# Patient Record
Sex: Female | Born: 1965
Health system: Southern US, Community
[De-identification: ages and names within clinical notes are randomized; demographics above are authoritative.]

## PROBLEM LIST (undated history)

## (undated) DIAGNOSIS — E079 Disorder of thyroid, unspecified: Secondary | ICD-10-CM

## (undated) DIAGNOSIS — F32A Depression, unspecified: Secondary | ICD-10-CM

## (undated) DIAGNOSIS — K219 Gastro-esophageal reflux disease without esophagitis: Secondary | ICD-10-CM

## (undated) DIAGNOSIS — E282 Polycystic ovarian syndrome: Secondary | ICD-10-CM

## (undated) DIAGNOSIS — E119 Type 2 diabetes mellitus without complications: Secondary | ICD-10-CM

## (undated) DIAGNOSIS — D649 Anemia, unspecified: Secondary | ICD-10-CM

## (undated) DIAGNOSIS — I10 Essential (primary) hypertension: Secondary | ICD-10-CM

## (undated) DIAGNOSIS — Z85850 Personal history of malignant neoplasm of thyroid: Secondary | ICD-10-CM

## (undated) DIAGNOSIS — F419 Anxiety disorder, unspecified: Secondary | ICD-10-CM

## (undated) DIAGNOSIS — C801 Malignant (primary) neoplasm, unspecified: Secondary | ICD-10-CM

## (undated) DIAGNOSIS — T7840XA Allergy, unspecified, initial encounter: Secondary | ICD-10-CM

## (undated) HISTORY — DX: Malignant (primary) neoplasm, unspecified: C80.1

## (undated) HISTORY — DX: Allergy, unspecified, initial encounter: T78.40XA

## (undated) HISTORY — DX: Gastro-esophageal reflux disease without esophagitis: K21.9

## (undated) HISTORY — DX: Disorder of thyroid, unspecified: E07.9

## (undated) HISTORY — DX: Personal history of malignant neoplasm of thyroid: Z85.850

## (undated) HISTORY — DX: Anxiety disorder, unspecified: F41.9

## (undated) HISTORY — DX: Depression, unspecified: F32.A

## (undated) HISTORY — DX: Anemia, unspecified: D64.9

## (undated) HISTORY — DX: Type 2 diabetes mellitus without complications: E11.9

## (undated) HISTORY — DX: Polycystic ovarian syndrome: E28.2

---

## 1998-01-15 ENCOUNTER — Other Ambulatory Visit: Admission: RE | Admit: 1998-01-15 | Discharge: 1998-01-15 | Payer: Self-pay | Admitting: Gynecology

## 1999-01-21 ENCOUNTER — Other Ambulatory Visit: Admission: RE | Admit: 1999-01-21 | Discharge: 1999-01-21 | Payer: Self-pay | Admitting: Gynecology

## 2000-03-01 ENCOUNTER — Other Ambulatory Visit: Admission: RE | Admit: 2000-03-01 | Discharge: 2000-03-01 | Payer: Self-pay | Admitting: Gynecology

## 2000-05-01 ENCOUNTER — Inpatient Hospital Stay (HOSPITAL_COMMUNITY): Admission: AD | Admit: 2000-05-01 | Discharge: 2000-05-01 | Payer: Self-pay | Admitting: Gynecology

## 2000-08-25 ENCOUNTER — Inpatient Hospital Stay (HOSPITAL_COMMUNITY): Admission: RE | Admit: 2000-08-25 | Discharge: 2000-08-25 | Payer: Self-pay | Admitting: Obstetrics and Gynecology

## 2000-08-25 ENCOUNTER — Encounter: Payer: Self-pay | Admitting: Obstetrics and Gynecology

## 2000-08-26 ENCOUNTER — Inpatient Hospital Stay (HOSPITAL_COMMUNITY): Admission: AD | Admit: 2000-08-26 | Discharge: 2000-08-26 | Payer: Self-pay | Admitting: Obstetrics and Gynecology

## 2000-09-15 ENCOUNTER — Encounter: Payer: Self-pay | Admitting: Obstetrics and Gynecology

## 2000-09-15 ENCOUNTER — Ambulatory Visit (HOSPITAL_COMMUNITY): Admission: RE | Admit: 2000-09-15 | Discharge: 2000-09-15 | Payer: Self-pay | Admitting: Obstetrics and Gynecology

## 2000-09-28 ENCOUNTER — Encounter: Admission: RE | Admit: 2000-09-28 | Discharge: 2000-12-27 | Payer: Self-pay | Admitting: Obstetrics and Gynecology

## 2000-11-07 ENCOUNTER — Observation Stay (HOSPITAL_COMMUNITY): Admission: AD | Admit: 2000-11-07 | Discharge: 2000-11-08 | Payer: Self-pay | Admitting: Obstetrics and Gynecology

## 2000-11-08 ENCOUNTER — Encounter: Payer: Self-pay | Admitting: Obstetrics and Gynecology

## 2000-11-27 ENCOUNTER — Inpatient Hospital Stay (HOSPITAL_COMMUNITY): Admission: AD | Admit: 2000-11-27 | Discharge: 2000-11-30 | Payer: Self-pay | Admitting: Obstetrics and Gynecology

## 2001-03-10 ENCOUNTER — Other Ambulatory Visit: Admission: RE | Admit: 2001-03-10 | Discharge: 2001-03-10 | Payer: Self-pay | Admitting: Gynecology

## 2002-01-15 ENCOUNTER — Emergency Department (HOSPITAL_COMMUNITY): Admission: EM | Admit: 2002-01-15 | Discharge: 2002-01-15 | Payer: Self-pay | Admitting: Emergency Medicine

## 2002-01-15 ENCOUNTER — Encounter: Payer: Self-pay | Admitting: Emergency Medicine

## 2002-02-21 ENCOUNTER — Emergency Department (HOSPITAL_COMMUNITY): Admission: EM | Admit: 2002-02-21 | Discharge: 2002-02-21 | Payer: Self-pay | Admitting: Emergency Medicine

## 2002-03-02 ENCOUNTER — Other Ambulatory Visit: Admission: RE | Admit: 2002-03-02 | Discharge: 2002-03-02 | Payer: Self-pay | Admitting: Gynecology

## 2002-03-14 ENCOUNTER — Encounter: Payer: Self-pay | Admitting: Gastroenterology

## 2002-03-14 ENCOUNTER — Ambulatory Visit (HOSPITAL_COMMUNITY): Admission: RE | Admit: 2002-03-14 | Discharge: 2002-03-14 | Payer: Self-pay | Admitting: Gastroenterology

## 2002-04-14 ENCOUNTER — Emergency Department (HOSPITAL_COMMUNITY): Admission: EM | Admit: 2002-04-14 | Discharge: 2002-04-14 | Payer: Self-pay | Admitting: Emergency Medicine

## 2002-04-24 ENCOUNTER — Encounter: Payer: Self-pay | Admitting: Surgery

## 2002-04-24 ENCOUNTER — Encounter (INDEPENDENT_AMBULATORY_CARE_PROVIDER_SITE_OTHER): Payer: Self-pay

## 2002-04-24 ENCOUNTER — Observation Stay (HOSPITAL_COMMUNITY): Admission: RE | Admit: 2002-04-24 | Discharge: 2002-04-25 | Payer: Self-pay | Admitting: Surgery

## 2002-05-18 DIAGNOSIS — C801 Malignant (primary) neoplasm, unspecified: Secondary | ICD-10-CM

## 2002-05-18 HISTORY — DX: Malignant (primary) neoplasm, unspecified: C80.1

## 2002-05-18 HISTORY — PX: CHOLECYSTECTOMY: SHX55

## 2003-05-19 HISTORY — PX: THYROIDECTOMY: SHX17

## 2003-06-27 ENCOUNTER — Other Ambulatory Visit: Admission: RE | Admit: 2003-06-27 | Discharge: 2003-06-27 | Payer: Self-pay | Admitting: Gynecology

## 2003-07-10 ENCOUNTER — Encounter (HOSPITAL_COMMUNITY): Admission: RE | Admit: 2003-07-10 | Discharge: 2003-10-08 | Payer: Self-pay | Admitting: Family Medicine

## 2003-07-18 ENCOUNTER — Encounter (INDEPENDENT_AMBULATORY_CARE_PROVIDER_SITE_OTHER): Payer: Self-pay | Admitting: Specialist

## 2003-07-18 ENCOUNTER — Ambulatory Visit (HOSPITAL_COMMUNITY): Admission: RE | Admit: 2003-07-18 | Discharge: 2003-07-18 | Payer: Self-pay | Admitting: Family Medicine

## 2003-08-30 ENCOUNTER — Encounter (INDEPENDENT_AMBULATORY_CARE_PROVIDER_SITE_OTHER): Payer: Self-pay | Admitting: Specialist

## 2003-08-30 ENCOUNTER — Observation Stay (HOSPITAL_COMMUNITY): Admission: RE | Admit: 2003-08-30 | Discharge: 2003-08-31 | Payer: Self-pay | Admitting: Surgery

## 2003-09-27 ENCOUNTER — Observation Stay (HOSPITAL_COMMUNITY): Admission: RE | Admit: 2003-09-27 | Discharge: 2003-09-28 | Payer: Self-pay | Admitting: Surgery

## 2003-09-27 ENCOUNTER — Encounter (INDEPENDENT_AMBULATORY_CARE_PROVIDER_SITE_OTHER): Payer: Self-pay | Admitting: Specialist

## 2003-12-10 ENCOUNTER — Encounter (HOSPITAL_COMMUNITY): Admission: RE | Admit: 2003-12-10 | Discharge: 2004-03-09 | Payer: Self-pay | Admitting: Endocrinology

## 2003-12-24 ENCOUNTER — Emergency Department (HOSPITAL_COMMUNITY): Admission: EM | Admit: 2003-12-24 | Discharge: 2003-12-24 | Payer: Self-pay | Admitting: Emergency Medicine

## 2004-02-18 ENCOUNTER — Encounter (HOSPITAL_COMMUNITY): Admission: RE | Admit: 2004-02-18 | Discharge: 2004-05-15 | Payer: Self-pay | Admitting: Endocrinology

## 2004-02-27 ENCOUNTER — Ambulatory Visit (HOSPITAL_COMMUNITY): Admission: RE | Admit: 2004-02-27 | Discharge: 2004-02-27 | Payer: Self-pay | Admitting: Endocrinology

## 2004-03-05 ENCOUNTER — Encounter (HOSPITAL_COMMUNITY): Admission: RE | Admit: 2004-03-05 | Discharge: 2004-05-15 | Payer: Self-pay | Admitting: Endocrinology

## 2004-11-24 ENCOUNTER — Encounter (HOSPITAL_COMMUNITY): Admission: RE | Admit: 2004-11-24 | Discharge: 2005-02-22 | Payer: Self-pay | Admitting: Endocrinology

## 2004-12-26 ENCOUNTER — Ambulatory Visit (HOSPITAL_COMMUNITY): Admission: RE | Admit: 2004-12-26 | Discharge: 2004-12-26 | Payer: Self-pay | Admitting: Endocrinology

## 2005-02-24 ENCOUNTER — Ambulatory Visit (HOSPITAL_COMMUNITY): Admission: RE | Admit: 2005-02-24 | Discharge: 2005-02-24 | Payer: Self-pay | Admitting: Endocrinology

## 2005-03-06 ENCOUNTER — Ambulatory Visit (HOSPITAL_COMMUNITY): Admission: RE | Admit: 2005-03-06 | Discharge: 2005-03-06 | Payer: Self-pay | Admitting: Endocrinology

## 2005-10-26 IMAGING — CR DG CHEST 2V
2 series · 2 of 2 positions shown · non-contrast
Comparison: none

CLINICAL DATA: Nonsmoker.  No chest complaints.  Left thyroid nodule.
 CHEST, TWO VIEWS 
 PA and lateral views of the chest are made without previous films for comparison and show the heart, lungs, bony thorax, and soft tissues to be within the limits of normal. 
 IMPRESSION
 Normal chest.

[view not recorded (1 of 2)]
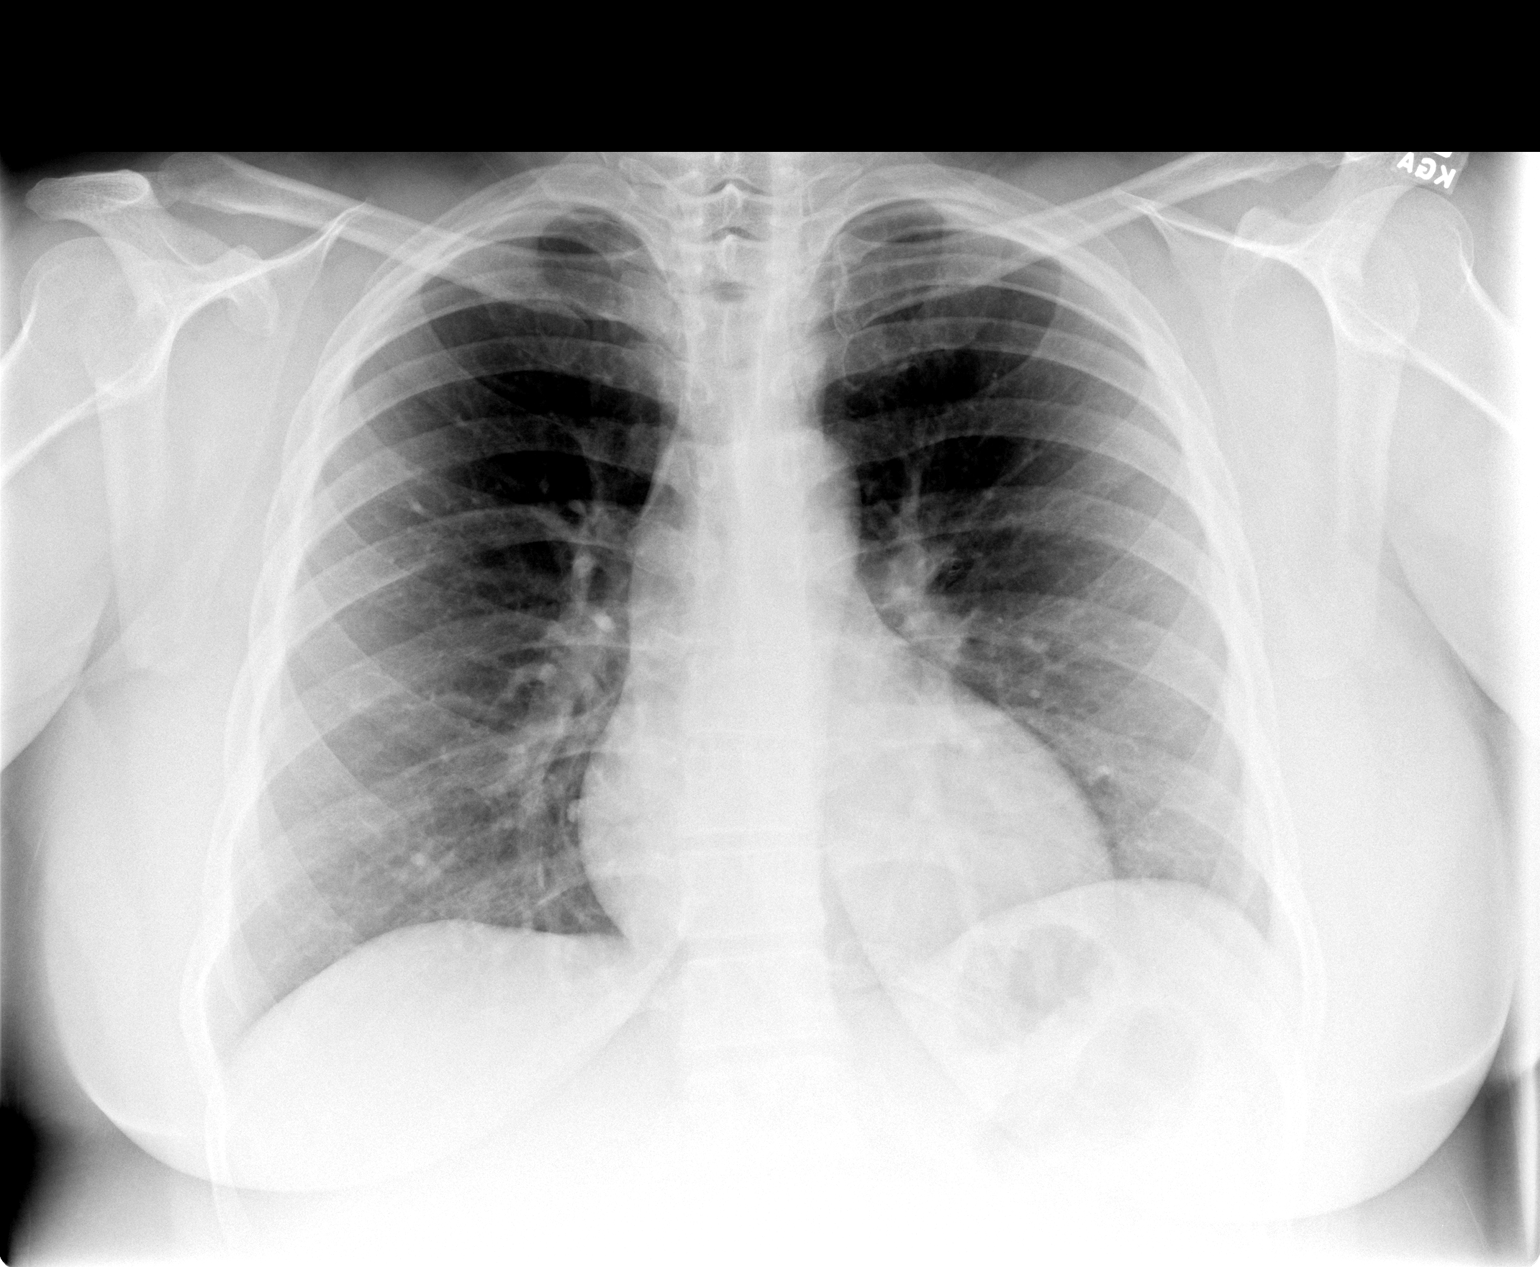

[view not recorded (2 of 2)]
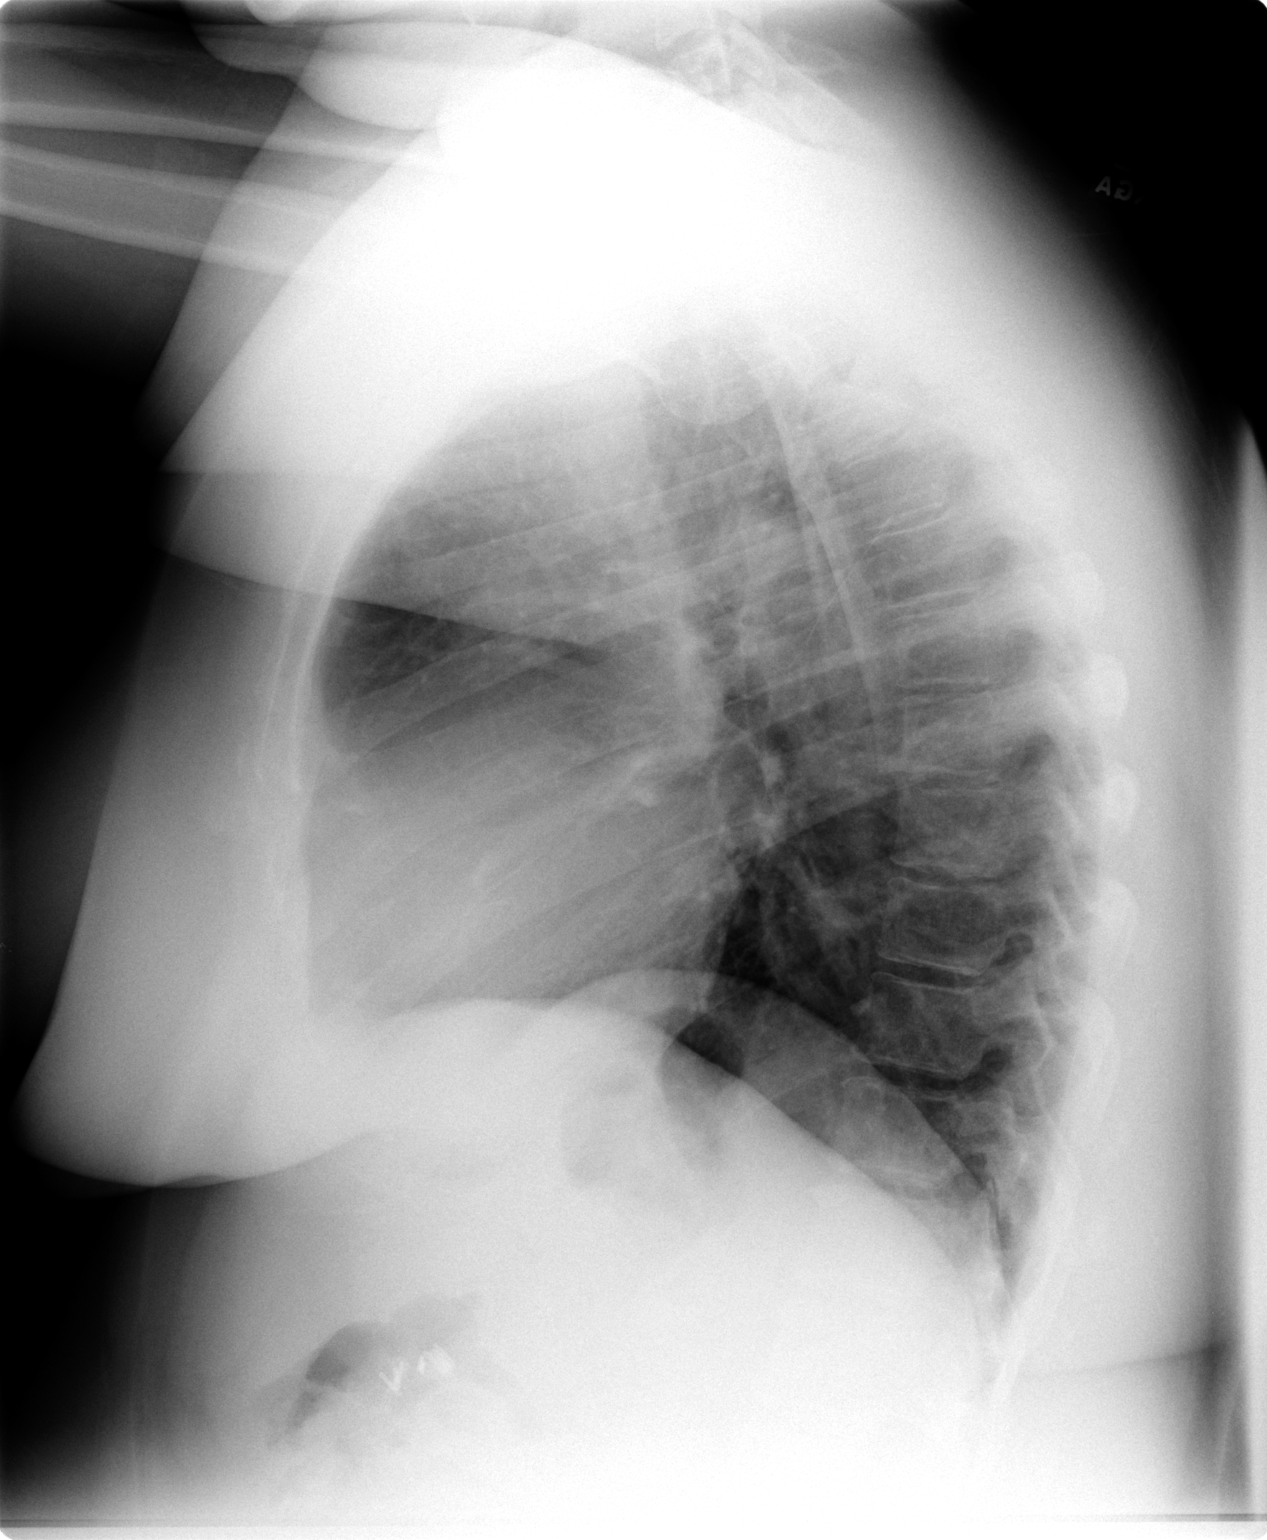

[2 of 2 positions shown; findings below may reference images not displayed]

## 2006-02-02 ENCOUNTER — Ambulatory Visit: Payer: Self-pay | Admitting: Family Medicine

## 2006-12-07 ENCOUNTER — Ambulatory Visit: Payer: Self-pay | Admitting: Family Medicine

## 2008-02-16 ENCOUNTER — Ambulatory Visit: Payer: Self-pay | Admitting: Family Medicine

## 2008-04-25 ENCOUNTER — Ambulatory Visit: Payer: Self-pay | Admitting: Family Medicine

## 2009-03-25 ENCOUNTER — Ambulatory Visit: Payer: Self-pay | Admitting: Family Medicine

## 2009-04-05 ENCOUNTER — Ambulatory Visit: Payer: Self-pay | Admitting: Family Medicine

## 2009-07-11 ENCOUNTER — Ambulatory Visit: Payer: Self-pay | Admitting: Family Medicine

## 2010-05-05 ENCOUNTER — Ambulatory Visit: Payer: Self-pay | Admitting: Family Medicine

## 2010-06-08 ENCOUNTER — Encounter: Payer: Self-pay | Admitting: Obstetrics and Gynecology

## 2010-06-08 ENCOUNTER — Encounter: Payer: Self-pay | Admitting: Endocrinology

## 2010-07-07 ENCOUNTER — Encounter (HOSPITAL_COMMUNITY): Payer: Self-pay | Admitting: Radiology

## 2010-07-07 ENCOUNTER — Emergency Department (HOSPITAL_COMMUNITY): Payer: BC Managed Care – PPO

## 2010-07-07 ENCOUNTER — Emergency Department (HOSPITAL_COMMUNITY)
Admission: EM | Admit: 2010-07-07 | Discharge: 2010-07-07 | Disposition: A | Discharge status: Home / Self Care | Payer: BC Managed Care – PPO | Attending: Emergency Medicine | Admitting: Emergency Medicine

## 2010-07-07 DIAGNOSIS — R0989 Other specified symptoms and signs involving the circulatory and respiratory systems: Secondary | ICD-10-CM | POA: Insufficient documentation

## 2010-07-07 DIAGNOSIS — E876 Hypokalemia: Secondary | ICD-10-CM | POA: Insufficient documentation

## 2010-07-07 DIAGNOSIS — R05 Cough: Secondary | ICD-10-CM | POA: Insufficient documentation

## 2010-07-07 DIAGNOSIS — R0609 Other forms of dyspnea: Secondary | ICD-10-CM | POA: Insufficient documentation

## 2010-07-07 DIAGNOSIS — I10 Essential (primary) hypertension: Secondary | ICD-10-CM | POA: Insufficient documentation

## 2010-07-07 DIAGNOSIS — Z79899 Other long term (current) drug therapy: Secondary | ICD-10-CM | POA: Insufficient documentation

## 2010-07-07 DIAGNOSIS — E039 Hypothyroidism, unspecified: Secondary | ICD-10-CM | POA: Insufficient documentation

## 2010-07-07 DIAGNOSIS — R059 Cough, unspecified: Secondary | ICD-10-CM | POA: Insufficient documentation

## 2010-07-07 DIAGNOSIS — J189 Pneumonia, unspecified organism: Secondary | ICD-10-CM | POA: Insufficient documentation

## 2010-07-07 DIAGNOSIS — R0602 Shortness of breath: Secondary | ICD-10-CM | POA: Insufficient documentation

## 2010-07-07 HISTORY — DX: Essential (primary) hypertension: I10

## 2010-07-07 LAB — BASIC METABOLIC PANEL
BUN: 14 mg/dL (ref 6–23)
CO2: 24 mEq/L (ref 19–32)
Calcium: 8.7 mg/dL (ref 8.4–10.5)
Chloride: 104 mEq/L (ref 96–112)
Creatinine, Ser: 1.01 mg/dL (ref 0.4–1.2)
Glucose, Bld: 126 mg/dL — ABNORMAL HIGH (ref 70–99)
Potassium: 3.4 mEq/L — ABNORMAL LOW (ref 3.5–5.1)
Sodium: 137 mEq/L (ref 135–145)

## 2010-07-07 LAB — CBC
Hemoglobin: 9.5 g/dL — ABNORMAL LOW (ref 12.0–15.0)
MCH: 23 pg — ABNORMAL LOW (ref 26.0–34.0)
MCHC: 30.5 g/dL (ref 30.0–36.0)
RBC: 4.13 MIL/uL (ref 3.87–5.11)
RDW: 16 % — ABNORMAL HIGH (ref 11.5–15.5)
WBC: 6.5 10*3/uL (ref 4.0–10.5)

## 2010-07-07 LAB — D-DIMER, QUANTITATIVE: D-Dimer, Quant: 0.71 ug/mL-FEU — ABNORMAL HIGH (ref 0.00–0.48)

## 2010-07-07 LAB — DIFFERENTIAL
Basophils Absolute: 0 10*3/uL (ref 0.0–0.1)
Basophils Relative: 0 % (ref 0–1)
Eosinophils Absolute: 0.1 10*3/uL (ref 0.0–0.7)
Eosinophils Relative: 2 % (ref 0–5)
Lymphocytes Relative: 31 % (ref 12–46)
Lymphs Abs: 2 10*3/uL (ref 0.7–4.0)
Monocytes Absolute: 0.4 10*3/uL (ref 0.1–1.0)
Monocytes Relative: 6 % (ref 3–12)
Neutro Abs: 4 10*3/uL (ref 1.7–7.7)

## 2010-07-07 LAB — POCT CARDIAC MARKERS
CKMB, poc: <1 ng/mL — ABNORMAL LOW (ref 1.0–8.0)
Myoglobin, poc: 90.5 ng/mL (ref 12–200)
Troponin i, poc: <0.05 ng/mL (ref 0.00–0.09)

## 2010-07-07 MED ORDER — IOHEXOL 300 MG/ML  SOLN
100.0000 mL | Freq: Once | INTRAMUSCULAR | Status: AC | PRN
  Administered 2010-07-07: 100 mL via INTRAVENOUS

## 2010-07-10 ENCOUNTER — Inpatient Hospital Stay (INDEPENDENT_AMBULATORY_CARE_PROVIDER_SITE_OTHER): Payer: BC Managed Care – PPO | Admitting: Family Medicine

## 2010-07-10 DIAGNOSIS — D649 Anemia, unspecified: Secondary | ICD-10-CM

## 2010-07-10 DIAGNOSIS — J189 Pneumonia, unspecified organism: Secondary | ICD-10-CM

## 2010-09-04 ENCOUNTER — Ambulatory Visit (INDEPENDENT_AMBULATORY_CARE_PROVIDER_SITE_OTHER): Payer: BC Managed Care – PPO | Admitting: Family Medicine

## 2010-09-04 ENCOUNTER — Other Ambulatory Visit: Payer: Self-pay | Admitting: Family Medicine

## 2010-09-04 ENCOUNTER — Ambulatory Visit
Admission: RE | Admit: 2010-09-04 | Discharge: 2010-09-04 | Disposition: A | Discharge status: Home / Self Care | Payer: BC Managed Care – PPO | Source: Ambulatory Visit | Attending: Family Medicine | Admitting: Family Medicine

## 2010-09-04 DIAGNOSIS — R0602 Shortness of breath: Secondary | ICD-10-CM

## 2010-09-04 DIAGNOSIS — D649 Anemia, unspecified: Secondary | ICD-10-CM

## 2010-09-04 DIAGNOSIS — Z79899 Other long term (current) drug therapy: Secondary | ICD-10-CM

## 2010-09-04 DIAGNOSIS — R948 Abnormal results of function studies of other organs and systems: Secondary | ICD-10-CM

## 2010-09-09 ENCOUNTER — Other Ambulatory Visit (INDEPENDENT_AMBULATORY_CARE_PROVIDER_SITE_OTHER): Payer: BC Managed Care – PPO

## 2010-09-09 DIAGNOSIS — Z79899 Other long term (current) drug therapy: Secondary | ICD-10-CM

## 2010-10-03 NOTE — Op Note (Signed)
NAME:  Katie Aguirre, Katie Aguirre                      ACCOUNT NO.:  [id number]   MEDICAL RECORD NO.:  [id number]                   PATIENT TYPE:  OBV   LOCATION:  6962                                 FACILITY:  [facility]   PHYSICIAN:  Abigail Miyamoto, M.D.              DATE OF BIRTH:  [birth year]   DATE OF PROCEDURE:  08/30/2003  DATE OF DISCHARGE:                                 OPERATIVE REPORT   PREOPERATIVE DIAGNOSIS:  Left thyroid nodule.   POSTOPERATIVE DIAGNOSIS:  Left thyroid nodule.   PROCEDURE:  Left thyroid lobectomy.   SURGEON:  Dr. Riley Lam A. Magnus Ivan.   ASSISTANT:  Dr. Cyndia Bent.   ANESTHESIA:  General endotracheal.   ESTIMATED BLOOD LOSS:  Minimal.   INDICATIONS:  Katie Aguirre is a 45 year old female, who was found to  have a left thyroid nodule.  Fine needle aspiration revealed follicular  cells.  Ultrasound showed this to have both cystic and solid components.   FINDINGS:  The patient was found to have a cystic and solid nodule in the  left lobe of the thyroid gland.  Frozen section revealed this to be  consistent with a benign hyperplastic nodule.   PROCEDURE IN DETAIL:  The patient was brought to the operating room and  identified as Katie Aguirre.  She was placed supine on the operating  table, and general anesthesia was induced.  Her neck was then prepped and  draped in the usual sterile fashion.  Using a #15 blade, a transverse  incision was then created on the lower neck.  Incision was carried down  through the platysma with electrocautery.  Superior and inferior skin flaps  were then created with the electrocautery.  The midline was then identified  and opened with the electrocautery.  The strap muscles were then reflected  laterally.  The thyroid gland was easily identified and retracted medially.  Dissection was then carried out around the capsule of the thyroid gland.  The middle vein was easily identified and clipped proximally and distally  and transected.  The upper pole of the thyroid gland was then controlled  with 2-0 silk ties as well as surgical clips before being released.  Dissection was done along the capsule in order to stay close to the thyroid  gland and avoid any nerve injuries.  The mid portion of the thyroid gland  was stuck to the structures of the neck and had to be dissected out  carefully with the right-angle clamp as well as the electrocautery and  surgical clips.  The middle thyroid artery was identified and clipped as  well.  The recurrent laryngeal nerve was also identified and spared.  The  parathyroid glands were likewise identified and left intact.  Dissection was  then carried out further along the inferior pole which was released with  both the electrocautery and surgical clips as well.  The remaining gland was  then freed up off the trachea with  the electrocautery.  The nodule was  identified and was found to be both cystic and solid in components.  Once  the gland was completely removed in its entirety with electrocautery, it was  sent to pathology for identification.  Frozen section again revealed a  benign-appearing nodule.  At this point, the neck was thoroughly irrigated  with normal saline.  Again, hemostasis appeared to be achieved with the  surgical clips as well as the piece of Surgicel.  The midline was then  reapproximated with interrupted 3-0 Vicryl sutures.  The platysma was then  closed with interrupted 3-0 Vicryl sutures.  The skin was closed with a  running 4-0 Monocryl.  Steri-Strips, gauze, and tape were then applied.  The patient tolerated the  procedure well.  All sponge, needle, and instrument counts were correct at  the end of the procedure.  The patient was then extubated in the operating  room and taken in stable condition to the recovery room.                                               Abigail Miyamoto, M.D.    DB/MEDQ  D:  08/30/2003  T:  08/30/2003  Job:  [id number]

## 2010-10-03 NOTE — Discharge Summary (Signed)
[facility]  Patient:    Katie Aguirre, Katie Aguirre                     MRN: [id number] Adm. Date:  [id number] Disc. Date: [id number] Attending:  Shaune Spittle Dictator:   Vance Gather Duplantis, C.N.M.                           Discharge Summary  ADMISSION DIAGNOSES:          1. Intrauterine pregnancy at [redacted] weeks                                  gestation.                               2. Status post fall.                               3. A 0.3% positive Kleihauer-Betke.  DISCHARGE DIAGNOSES:          1. Intrauterine pregnancy at [redacted] weeks                                  gestation.                               2. Status post fall.                               3. A 0.3% positive Kleihauer-Betke.  PROCEDURES THIS ADMISSION:    None.  HOSPITAL COURSE:              Ms. Katie Aguirre (date of birth: 1965-09-16) is a 45 year old married black female, gravida 2, para 0-0-1-0 at [redacted] weeks gestation who was admitted for observation secondary to a fall as she was leaving church yesterday, June 23. She was evaluated initially in Maternity Admissions and found to have a 0.3% positive Kleihauer-Betke test for which then she was recommended to be admitted for overnight observation. Her fetal heart rate has remained reactive and reassuring throughout her hospital stay. She has had no vaginal bleeding or leaking. She reports positive fetal movement. She has no abdominal pain and no trauma to any other limbs or parts of her body. She had an ultrasound this morning that had a biophysical profile score of 8/8 with a placenta that is posterior and grade 1 with no signs of abruption noted and AFI of 15.0 cm. She is deemed ready for discharge at this time.  DISCHARGE INSTRUCTIONS:       She is to call for signs and symptoms of labor, for vaginal bleeding, or increasing abdominal pain.  DISCHARGE LABORATORIES:       Her hemoglobin is 11.3, her WBC count is 11.6, her  platelets are 339,000, and her Kleihauer-Betke had 0.3% fetal cells noted. Her blood type is B positive.  DISCHARGE FOLLOWUP:           The patient is to follow up on Wednesday, June 26 for her regularly scheduled prenatal visit or p.r.n. DD:  11/08/00 TD:  11/08/00 Job: [id number]

## 2010-10-03 NOTE — Op Note (Signed)
[facility]  Patient:    Katie Aguirre, Katie Aguirre                     MRN: [id number] Proc. Date: 11/28/00 Adm. Date:  [id number] Attending:  Leonard Schwartz                           Operative Report  PREOPERATIVE DIAGNOSES:       1. A 38-week gestation.                               2. Hypertension.                               3. Diabetes.  POSTOPERATIVE DIAGNOSES:      1. A 38-week gestation.                               2. Hypertension.                               3. Diabetes.                               4. Shoulder dystocia.  PROCEDURE:                    Normal spontaneous vaginal delivery with repair of second-degree midline laceration.  OBSTETRICIAN:                 Janine Limbo, M.D.  ANESTHESIA:                   Epidural and 1% Xylocaine.  INDICATIONS:                  The patient is a 45 year old female, gravida 2, para 0-0-1-0 who was admitted at 38-weeks gestation for induction of labor because of hypertension and diabetes.  Cytotec was placed on November 27, 2000. Pitocin was started on November 28, 2000.  The patient labored quickly.  SHe became completely dilated and she pushed effectively.  FINDINGS:                     The weight of the infant is currently not known. A female was delivered from an occiput anterior presentation.  Apgars were 8 at one minute and 9 at five minutes.  There was a three-vessel umbilical cord present.  There was a nuchal cord present.  The placental surfaces appeared normal.  There was a second-degree midline laceration present.  The infant was vigorous, but did not move the right arm as vigorously as the left arm.  I questioned a fractured right clavicle.  DESCRIPTION OF PROCEDURE:     The patient was placed in the lithotomy position.  The perineum was prepped with multiple layers of Betadine and then sterilely draped.  The patient effectively pushed the babys head out.  The mouth and nose  were suctioned.  A nuchal cord was reduced.  Shoulder dystocia was encountered.  McRoberts maneuver was performed.  We were then able to deliver the shoulders without difficulty.  The mother quickly delivered the remainder of the infant.  The infant was allowed to rest below the level of the maternal heart.  The cord was clamped and cut.  The infant was then placed on the mothers abdomen.  The infant was vigorous, but did not move the right arm as vigorously as the left arm.  The placenta was delivered without difficulty.  A second-degree midline laceration was repaired in a standard fashion using 2-0 Vicryl.  Hemostasis was adequate.  The patient was returned to the supine position and allowed to bond with her infant. DD:  11/28/00 TD:  11/28/00 Job: [id number]

## 2010-10-03 NOTE — Discharge Summary (Signed)
[facility] of [facility]  Patient:    Katie Aguirre, Katie Aguirre                     MRN: [id number] Adm. Date:  [id number] Disc. Date: [id number] Attending:  Leonard Schwartz                           Discharge Summary  DISCHARGE DIAGNOSES:          1. 38 gestation.                               2. Hypertension.                               3. Diabetes.                               4. Shoulder dystocia.                               5. Anemia (hemoglobin 9.8).                               6. Questionable fractured clavicle of the                                  infant.  PROCEDURES THIS ADMISSION:    On November 28, 2000, normal spontaneous vaginal delivery with repair of second degree midline laceration.  HISTORY OF PRESENT ILLNESS:   Katie Aguirre is a 45 year old female, gravida 2, para 0-0-1-0, who presented at 39-1/[redacted] weeks gestation for induction of labor. Please see her dictated history and physical exam for details.  ADMISSION PHYSICAL EXAMINATION:                  The patient is afebrile.  Her vital signs are stable.  Her cervix was 2 cm dilated, 50% effaced, and -3 in station.  ADMISSION LABORATORY VALUES:  Blood type is B+.  Rubella immune.  HBSAG negative.  RPR is negative.  HOSPITAL COURSE:              The patient was admitted and she was started on Pitocin.  She labored quickly.  She became completely dilated and pushed effectively.  The patient delivered an 8 pound 5 ounce female infant ([place]). A mild shoulder dystocia was encountered.  The infant was easily delivered, but there was a question of a fractured right clavicle.  The patient tolerated the procedure well.  Her postpartum hemoglobin was 9.8 (the prepartum hemoglobin was 11.3).  She quickly tolerated a regular diet.  On postpartum day #2, she was felt to have recovered significantly and was ready for discharge.  DISPOSITION:                  She was discharged to home in stable  condition.  DISCHARGE MEDICATIONS:        The patient was given Motrin and Tylox for pain. She will continue her prenatal vitamins and also take iron.  FOLLOW-UP:  She will return to [facility]cology in six weeks for a follow-up examination. DD:  12/30/00 TD:  12/30/00 Job: [id number]

## 2010-10-03 NOTE — Consult Note (Signed)
[facility]  Patient:    Katie Aguirre, Katie Aguirre                     MRN: [id number] Adm. Date:  [id number] Attending:  Rolinda Roan                          Consultation Report  EMERGENCY ROOM  REASON FOR CONSULTATION: Vaginal bleeding.  HISTORY OF PRESENT ILLNESS: The patient is a 45 year old gravida 2 para 0 AB 1 female, with her last menstrual period April 08, 2000.  She was seen by Dr. Chevis Pretty yesterday in his office and even though she had not missed a period she was pregnant, and when she started to spot yesterday Dr. Chevis Pretty did an ultrasound, showing a nine week viable pregnancy.  The patient did well yesterday and today, and then tonight she had sudden onset of heavy bleeding associated with some cramping.  She became very concerned because of the heaviness of the bleeding and called me and I asked her to come to the emergency room at [facility] [place].  By the time she got here the cramping and the bleeding had almost stopped, and she was doing fine.  PHYSICAL EXAMINATION: External and vaginal examination was within normal limits.  The cervix was closed.  There was minimal old blood noted in the vagina.  The uterus was enlarged eight to nine week size.  Adnexa palpably normal.  Rectal was negative.  IMPRESSION: Threatened abortion.  RECOMMENDATIONS/PLAN: I discussed with the patient and her husband rescanning her tonight.  I told them, however, that with a viable scan yesterday that even if the scan did not confirm viability or not I would not recommend a dilatation and curettage and would instead ask her to be rescanned during the week, and since she was now stable and the bleeding had stopped I did not really feel that rescanning her tonight would change anything.  The patient and her husband were comfortable with this approach and I have discharged them home.  They are doing to call Dr. Chevis Pretty Monday and schedule  another ultrasound; however, I have asked her to call me if there is any change in her condition. DD:  05/01/00 TD:  05/02/00 Job: [id number]

## 2010-10-03 NOTE — H&P (Signed)
[facility] of [facility]  Patient:    Katie Aguirre, Katie Aguirre                     MRN: [id number] Adm. Date:  [id number] Attending:  Shaune Spittle Dictator:   Mack Guise, C.N.M.                         History and Physical  HISTORY OF PRESENT ILLNESS:   Katie Aguirre is a 45 year old, gravida 2, para 0-0-1-0 at 37 weeks. She presented to [facility] after a fall today. While walking to her car after church, she tripped and landed on her right knee and left side and buttocks and broke her fall with her left hand. Now, she does complain of cramping, no bleeding. She does report positive fetal movement. Her pregnancy has been followed by the MD service at [facility] and is remarkable for (1) a history of chronic hypertension, (2) first trimester spotting, (3) a history of an abnormal Pap smear, (4) history of GC as a teen, (5) history of migraines, and (6) obesity.  PRENATAL LABORATORY DATA:     Prenatal lab work on May 26, 2000 finds hemoglobin and hematocrit 11.4 and 35.8, platelets 294,000, blood type and Rh B positive, antibody screen negative, sickle cell trait negative, rubella immune, hepatitis B surface antigen negative, HIV declined. Pap smear within normal limits, GC and Chlamydia negative. On June 09, 2000 found increased downs syndrome risk on AFP, followup was done with ultrasound. Patient and husband declined amniocentesis. One-hour glucose challenge was elevated and patient underwent three-hour GTT and has been following a 2000 calorie ADA diet.  LABORATORY WORK TODAY:        Patient has white blood cell count 11.6, hemoglobin 11.3 and hematocrit 34.8, platelets 339,000. KLB found fetal cells 0.3% and quantitative fetal hemoglobin 15.  OBSTETRIC HISTORY:            1984, SAB and the present pregnancy.  PAST MEDICAL HISTORY:         History of gonorrhea in 1984, history of chronic hypertension, no medications at the present time and a history of  migraines.  FAMILY HISTORY:               Patients mother with a history of hypertension. Patients sister and mother with a history of anemia, and maternal grandmother and paternal grandmother diabetes. Maternal grandfather kidney disease with dialysis. Maternal grandmother breast cancer.  GENETIC HISTORY:              There is no family history of genetic or chromosomal disorders, children that died in infancy, or were born with birth defects.  SOCIAL HISTORY:               Katie Aguirre is a 45 year old African-American married female. Her husband, Deneene Tarver, is involved and supportive. They are of the [facility] faith.  REVIEW OF SYSTEMS:            Review of systems are as described above. Patient fell, there is no bruising, scratches notable on her right knee, left buttocks, or side and she is moving all extremities well.  PHYSICAL EXAMINATION:  VITAL SIGNS:                  Vital signs stable, afebrile.  HEENT:                        Unremarkable.  HEART:  Regular rate and rhythm.  LUNGS:                        Clear.  ABDOMEN:                      Soft and nontender, gravid in its contour. Uterine fundus is noted to extend 37 cm above the level of the pubic symphysis. Leopolds maneuvers finds the infant to be in a longitudinal lie, cephalic presentation, and the estimated fetal weight is 7 pounds.  ELECTRONIC FETAL MONITOR:     The baseline of the fetal heart rate monitor is 13-140 with average long-term variability, reactivity is present. There is occasional mild variable decelerations noted. The patient has mild uterine irritability noted.  ASSESSMENT:                   Intrauterine pregnancy at 37 weeks, status post fall, Kleihauer-Betke 0.3%.  PLAN:                         Admit for 23 hour observation per Dr. Marline Backbone. Continuous electronic fetal monitoring. Bed rest with bathroom privileges. Regular diet and Ambien for  sleep. DD:  11/07/00 TD:  11/07/00 Job: [id number]

## 2010-10-03 NOTE — Op Note (Signed)
NAMETERRILL, WAUTERS                        ACCOUNT NO.:  [id number]   MEDICAL RECORD NO.:  [id number]                   PATIENT TYPE:  AMB   LOCATION:  DAY                                  FACILITY:  [facility]   PHYSICIAN:  Abigail Miyamoto, M.D.              DATE OF BIRTH:  Feb 21, 1966   DATE OF PROCEDURE:  04/24/2002  DATE OF DISCHARGE:                                 OPERATIVE REPORT   PREOPERATIVE DIAGNOSIS:  Biliary diskinesia.   POSTOPERATIVE DIAGNOSES:  Biliary diskinesia, cholelithiasis, acute  cholecystitis.   SURGEON:  Abigail Miyamoto, M.D.   ASSISTANT:  Rose Phi. Maple Hudson, M.D.   ANESTHESIA:  General endotracheal anesthesia and 0.25% Marcaine.   ESTIMATED BLOOD LOSS:  Minimal.   FINDINGS:  The patient was found to have a normal cholangiogram. The  gallbladder appeared acutely inflamed and had very tiny gallstones in the  gallbladder.   DESCRIPTION OF PROCEDURE:  The patient was brought to the operating room,  identified as Katie Aguirre. She was placed supine on the operating table  and general anesthesia was induced. Her abdomen was then prepped and draped  in the usual sterile fashion. Using a #15 blade, a small transverse incision  was made below the umbilicus. The incision was carried down to the fascia  which was then opened with a scalpel. A hemostat was then used to pass  through the peritoneal cavity. Next #0 Vicryl pursestring suture was placed  around the fascial opening. The Hasson port was placed through the opening  and insufflation of the abdomen was begun. Next, a 12 mm port was placed in  the patient's epigastrium and two 5 mm ports were placed in the patient's  right flank under direct vision. The gallbladder was then identified and  grasped and retracted above the liver bed. The patient was found to have  multiple adhesions from the dome of the liver to the abdominal wall  consistent with previous peritoneal inflammation. The gallbladder itself  appeared acutely inflamed and appeared to contain tiny stones. Multiple  adhesions were then taken down to the neck of the gallbladder. The cystic  duct and artery were found to be closely adherent to each other and were  dissected out as one unit. The cystic duct was able to be separated from the  cystic artery as it came off the gallbladder. The cystic duct was then  clipped once distally. It was then partly transected with the scissors. An  Angiocath was then inserted under direct vision in the right upper quadrant.  The cholangiocath was then passed through the angiocath and into the cystic  duct. The cholangiogram was then performed under direct fluoroscopy. Good  flow of contrast was seen into the entire bile duct and duodenum without  evidence of abnormalities. The cholangiocatheter was then removed. The  cystic duct and artery were then clipped multiple times proximally and  distally before being transected with the scissors.  The gallbladder was then  dissected free from the liver bed with the electrocautery. During  dissection, the posterior branch of the cystic artery was identified and  clipped as well. Once the gallbladder was free from the liver bed,  hemostasis appeared to be achieved in the liver bed. The gallbladder was  then removed through the incision at the umbilicus. The #0 Vicryl suture at  the umbilicus was then tied in place closing the fascial defect. The abdomen  was then again irrigated with normal saline. Hemostasis appeared to be  achieved. All ports were then removed under direct vision and the abdomen  was deflated. All incisions were then anesthetized with 0.25% Marcaine and  then closed with 4-0 Monocryl subcuticular sutures.  Steri-Strips, gauze and tape were then applied. The patient tolerated the  procedure well. All sponge, needle and instrument counts were correct at the  end of the procedure. The patient was then extubated in the operating room  and  taken in stable condition to the recovery room.                                               Abigail Miyamoto, M.D.    DB/MEDQ  D:  04/24/2002  T:  04/24/2002  Job:  [id number]

## 2010-10-03 NOTE — Op Note (Signed)
NAME:  [name], [name]                      ACCOUNT NO.:  [id number]   MEDICAL RECORD NO.:  [id number]                   PATIENT TYPE:  OBV   LOCATION:  0478                                 FACILITY:  [facility]   PHYSICIAN:  Abigail Miyamoto, M.D.              DATE OF BIRTH:  01-15-1966   DATE OF PROCEDURE:  09/27/2003  DATE OF DISCHARGE:                                 OPERATIVE REPORT   PREOPERATIVE DIAGNOSES:  Thyroid cancer.   POSTOPERATIVE DIAGNOSES:  Thyroid cancer.   PROCEDURE:  Completion thyroidectomy.   SURGEON:  Abigail Miyamoto, M.D.   ASSISTANT:  Leonie Man, M.D.   ANESTHESIA:  General endotracheal anesthesia.   ESTIMATED BLOOD LOSS:  Minimal.   INDICATIONS FOR PROCEDURE:  Katie Aguirre is a 45 year old female who had  undergone a left thyroid lobectomy for a thyroid nodule. Frozen section was  consistent with follicular cells and no evidence of carcinoma; however, the  final pathology did reveal papillary cancer.  Given these findings, a  decision was made to proceed with completion thyroidectomy.   FINDINGS:  The previous resection site was completely stuck and _________  the neck therefore an attempt was not made to explore this area.  The right  gland appeared normal and was removed.   DESCRIPTION OF PROCEDURE:  The patient was brought to the operating room,  identified as Katie Aguirre. She was placed supine on the operating room  table and general anesthesia was induced. Her neck was then prepped and  draped in the usual sterile fashion. Using a #15 blade, a transverse  incision was made across the neck the previous scar. The incision was  carried down through the platysma with the electrocautery. Superior and  inferior skin flaps were then created with both blunt dissection and the  electrocautery freeing up the previously created skin flaps from the  previous surgery. The midline sutures were then identified and opened  freeing up the strap  muscles. The left side of the neck was examined and the  strap muscles were found to be fixated under the previous resection site  therefore a decision was made to forego resection here.  This was done in  order to preserve the recurrent laryngeal nerve.  At this point, the rest of  the strap muscles were dissected free from the right thyroid lobe. The lobe  was identified and elevated with blunt dissection.  Dissection was  undertaken to free up the lobe staying right on the thyroid gland. The  middle vein was identified and clipped twice proximally and once distally  and transected with the scissors. The superior pole was then identified, it  was taken down with several silk ties and surgical clips as well freeing up  the superior pole.  The parathyroid gland was identified and spared during  the dissection. The inferior pole was likewise taken down with 2-0 silk ties  as well as surgical clips.  This allowed  [place] to dissect toward the hilum of  the right lobe.  The dissection was carried to stay right on top of the  gland and several of the vessels were controlled with surgical clips and  transected with the Metzenbaum scissors. The recurrent laryngeal nerve was  easily identified and was spared.  The inferior parathyroid gland could not  be identified. At this point, after the rest of the vessels were identified  including the inferior thyroid artery which was transected, the thyroid was  dissected up off the trachea. The dissection was carried toward the midline  and the rest of the thyroid gland was removed. The right lobe was removed in  its entirety and sent to pathology for identification.  The wound was then  thoroughly irrigated with normal saline.  Hemostasis appeared to be  achieved. A small piece of Surgicel was then left in the wound.  The midline  strap muscles were then closed with running 2-0 Vicryl suture. The platysma  was then closed with interrupted 3-0 Vicryl sutures,  the skin was closed  with a running 4-0 Vicryl suture.  Steri-Strips, gauze and tape were then  applied. The patient tolerated the procedure well. All counts were correct  at the end of the procedure. The patient was then extubated in the operating  room and taken in stable condition to the recovery room.                                               Abigail Miyamoto, M.D.    DB/MEDQ  D:  09/27/2003  T:  09/27/2003  Job:  [id number]

## 2010-10-03 NOTE — H&P (Signed)
[facility]  Patient:    Katie Aguirre, Katie Aguirre                     MRN: [id number] Attending:  Janine Limbo, M.D. Dictator:   Philipp Deputy, C.N.M.                         History and Physical  DATE OF BIRTH:                12/06/1965  HISTORY OF PRESENT ILLNESS:   The patient is a 45 year old married African-American female gravida 2, para 0-0-1-0, at 39-3/7 weeks estimated gestational age who presents for scheduled induction of labor secondary to chronic hypertension and gestational diabetes mellitus.  Her pregnancy has been followed by the [facility] Ob/Gyn MD service and has been remarkable for:  1. Post trimester spotting. 2. Chronic hypertension. 3. History of abnormal Pap smear. 4. Obesity. 5. Migraines. 6. Gestational diabetes mellitus. 7. Group B strep negative.  PRENATAL LABORATORY DATA:     Blood type B-positive.  Antibody negative. Sickle cell trait negative.  Rubella immune.  Hepatitis B surface antigen negative.  Pap smear within normal limits, March 01, 2000.  Gonorrhea negative.  Chlamydia negative.  Hemoglobin and hematocrit 11.4 and 35.8, platelets 294,000.  These prenatal labs are from May 26, 2000.  On June 09, 2000, maternal serum alpha fetoprotein showed __________ syndrome risk. On August 27, 2000, one-hour glucola was elevated at 184.  On Sep 16, 2000, she attempted her three-hour glucose tolerance test.  Fasting blood sugar result was 101 and she vomited the rest of the glucola.  Culture of the vaginal tract for group B strep on November 03, 2000 was negative.  HISTORY OF PRESENT PREGNANCY:                    She presented for care at [redacted] weeks gestation. Ultrasound from June 30, 2000, with significant amount of anatomy not seen secondary to maternal body habitus.  On July 05, 2000, the patient had a 24-hour urine for protein as well as blood drawn for creatinine clearance. The patient was placed on  bed rest at [redacted] weeks gestation secondary to preterm cervical change.  She received betamethasone as well.  Cervix continued to show funneling at 28 to [redacted] weeks gestation.  Bed rest, _______ continued, as well as Macrobid given for urinary tract infection and _________ had severe itching at approximately 34 weeks for which she Dr. Joseph Art and was prescribed prednisone which she did not take.  The itching self-resolved.  The cervix continued to be closed, but significant effaced.  The patient was allowed to return to work at 36 weeks and the rest of her prenatal care was noncontributory.  OB HISTORY:                   In 1984, she had a spontaneous AB with no D&C. This pregnancy is her second pregnancy.  ALLERGIES:                    SULFA AND PENICILLIN.  PAST MEDICAL HISTORY:         She has a questionable history of chicken pox. She has used oral contraceptives in the past.  She had an abnormal Pap smear in 1995 x 1 with repeat normal.  When she had her miscarriage in 1984, was told she could never have a  child, but the reason unknown.  She was diagnosed with gonorrhea in 1984, frequent yeast infections.  The patient has a history of chronic hypertension.  The patient has a history of anemia, current diarrhea history, questionable kidney infection in 1984.  The patient has a history of migraines.  PAST SURGICAL HISTORY:        Noncontributory.  FAMILY HISTORY:               Mother has hypertension.  The patients sister and mother has anemia.  Maternal grandmother with insulin-dependent diabetes. Mother grandfather is on dialysis.  Mother has a history of migraines and maternal grandmother has a history of breast cancer.  GENETIC HISTORY:              Negative.  SOCIAL HISTORY:               She is married to Casimiro Needle who is involved and supportive.  They are of the [facility] faith.  They are both college educated and denied any alcohol, smoking, or illicit drug use with the  pregnancy.  PHYSICAL EXAMINATION:  VITAL SIGNS:                  Stable and she is afebrile.  HEENT:                        Within normal limits.  CHEST:                        Clear to auscultation.  HEART:                        Regular rate and rhythm.  ABDOMEN:                      Gravid in contour with rare uterine contractions.  Fetal heart rate is reactive and reassuring.  CERVIX EXAMINATION:           Two centimeters, 50% long, and vertex -3.  EXTREMITIES:                  Within normal limits.  ASSESSMENT:                   1. Intrauterine pregnancy at term.                               2. Chronic hypertension.                               3. Gestational diabetes mellitus.                               4. Group B streptococcus negative.  PLAN:                         1. To admit for induction of labor per                                  Dr. Stefano Gaul.  2. Routine CNM orders.                               3. Cytotec placement tonight with repeat in                                  four hours if not in active labor and begin                                  high dose Pitocin in the morning. DD:  11/27/00 TD:  11/28/00 Job: [id number]

## 2010-12-23 ENCOUNTER — Ambulatory Visit (INDEPENDENT_AMBULATORY_CARE_PROVIDER_SITE_OTHER): Payer: BC Managed Care – PPO | Admitting: Medical

## 2010-12-23 ENCOUNTER — Encounter: Payer: Self-pay | Admitting: Medical

## 2010-12-23 VITALS — BP 140/82 | HR 76 | Temp 98.1°F | Resp 16

## 2010-12-23 DIAGNOSIS — R42 Dizziness and giddiness: Secondary | ICD-10-CM

## 2010-12-23 DIAGNOSIS — J069 Acute upper respiratory infection, unspecified: Secondary | ICD-10-CM

## 2010-12-23 DIAGNOSIS — Z9009 Acquired absence of other part of head and neck: Secondary | ICD-10-CM

## 2010-12-23 DIAGNOSIS — E890 Postprocedural hypothyroidism: Secondary | ICD-10-CM

## 2010-12-23 DIAGNOSIS — Z9889 Other specified postprocedural states: Secondary | ICD-10-CM

## 2010-12-23 DIAGNOSIS — D649 Anemia, unspecified: Secondary | ICD-10-CM

## 2010-12-23 MED ORDER — CETIRIZINE HCL 10 MG PO TABS: 10.0000 mg | ORAL_TABLET | Freq: Every day | ORAL | Status: DC

## 2010-12-23 MED ORDER — FLUTICASONE PROPIONATE 50 MCG/ACT NA SUSP: 2.0000 | Freq: Every day | NASAL | Status: DC

## 2010-12-23 NOTE — Progress Notes (Signed)
Subjective:   HPI  Katie Aguirre is a 45 y.o. female who presents for multiple issues.  She notes 1 day hx/o lightheadedness, nausea, sinus pressure, ear pressure, headache, some night sweats last night.  Prior to yesterday felt fine.  She was treated for bronchitis 1.5 mo ago.  In the past used Zyrtec and Fluticasone for similar, but ran out of this.   She is also here for recheck on labs for anemia and thyroid.  She has only been taking iron about twice a week.  Last labs in April.  She notes heavy periods, but denies other bleeding.  No other aggravating or relieving factors.  No other c/o.  The following portions of the patient's history were reviewed and updated as appropriate: allergies, current medications, past family history, past medical history, past social history, past surgical history and problem list.  Past Medical History  Diagnosis Date  . Hypertension   . Polycystic ovarian syndrome   . Vitamin D deficiency   . Anemia   . Hx of thyroid cancer     Review of Systems Constitutional: +fatigue; denies fever, chills, sweats, unexpected weight change, anorexia Dermatology: denies rash ENT: +sinus pressure; denies runny nose, sore throat Cardiology: denies chest pain, palpitations, edema Respiratory: denies cough, shortness of breath, wheezing Gastroenterology: +nausea; denies abdominal pain, vomiting, diarrhea, constipation Hematology: denies bleeding or bruising problems Musculoskeletal: denies arthralgias, myalgias, back pain Ophthalmology: denies vision changes Urology: denies dysuria, difficulty urinating, hematuria, urinary frequency, urgency Neurology: no weakness, tingling, numbness      Objective:   Physical Exam  General appearance: alert, no distress, WD/WN, black female HEENT: normocephalic, sclerae anicteric, PERRLA, EOMi, TMs pearly, nares with mild turbinate edema and erythema, no discharge, pharynx normal Oral cavity: MMM, no lesions Neck: supple,  no lymphadenopathy, no thyromegaly, no masses, no bruits Heart: RRR, normal S1, S2, no murmurs Lungs: CTA bilaterally, no wheezes, rhonchi, or rales Extremities: no edema, no cyanosis, no clubbing Pulses: 2+ symmetric, upper and lower extremities, normal cap refill Neurological: alert, oriented x 3, CN2-12 intact   Assessment :    Encounter Diagnoses  Name Primary?  . URI (upper respiratory infection) Yes  . Dizziness   . Anemia   . Hx of thyroidectomy       Plan:   URI - advised good hydration, rest, scripts for Fluticasone and Zyrtec given, avoid sudden motion, and call if not improving or worse.    Dizziness - seems related to sinus congestion.  If not improving, consider Meclizine.  Anemia - labs today. If still low, may need additional eval.  Hypothyroidism s/p thyroidectomy - labs today

## 2010-12-24 ENCOUNTER — Telehealth: Payer: Self-pay | Admitting: *Deleted

## 2010-12-24 LAB — CBC
HCT: 36.2 % (ref 36.0–46.0)
Hemoglobin: 10.9 g/dL — ABNORMAL LOW (ref 12.0–15.0)
RBC: 4.6 MIL/uL (ref 3.87–5.11)
WBC: 6.4 10*3/uL (ref 4.0–10.5)

## 2010-12-24 LAB — TSH: TSH: 57.279 u[IU]/mL — ABNORMAL HIGH (ref 0.350–4.500)

## 2010-12-24 NOTE — Telephone Encounter (Addendum)
Message copied by Dorthula Perfect on Wed Dec 24, 2010 10:01 AM ------      Message from: Aleen Campi, DAVID S      Created: Wed Dec 24, 2010  9:52 AM       Her thyroid level is under corrected.  Has she been taking her Synthroid every day?  If not, take it EVERYDAY.  If she has been good about taking it daily, then I'll increase the dose.  Her labs still show anemia but mildly improved.  Ask her to taker her iron at least once daily with food.   Have her recheck in 1 month on anemia and thyroid (OV).  Or if she is over a year out on last physical, I'd recommend she come in soon for her yearly physical.               FYI - hx/o total thyroidectomy 2005 for papillary cancer.  She did note heavy periods, but no other bleeding source.  No hx/o colonoscopy.   Pt notified of lab results.  Pt states that she has not been taking her Synthroid everyday and was advised to take it everyday.  Pt was also advised to take her iron pill everyday with a meal.  Pt will call back to schedule an appointment for a physical and to recheck anemia and thyroid level.  CM, LPN

## 2010-12-25 ENCOUNTER — Telehealth: Payer: Self-pay | Admitting: Medical

## 2010-12-26 ENCOUNTER — Other Ambulatory Visit: Payer: Self-pay | Admitting: Medical

## 2010-12-26 MED ORDER — AZITHROMYCIN 250 MG PO TABS: ORAL_TABLET | ORAL | Status: AC

## 2010-12-26 NOTE — Telephone Encounter (Signed)
Spoke with pt this morning and she is agreeable to do another round of antibiotics first.  Informed her that the prescription would be sent to pharmacy.  CM, LPN

## 2010-12-26 NOTE — Telephone Encounter (Signed)
If not better, lets have her do a round of antibiotic such as Azithromycin since symptoms may be progressing to sinus infection.  If agreeable I'll send script.  I think we should try this first instead of a referral.

## 2010-12-29 ENCOUNTER — Other Ambulatory Visit: Payer: Self-pay

## 2010-12-29 MED ORDER — LISINOPRIL-HYDROCHLOROTHIAZIDE 20-12.5 MG PO TABS: 1.0000 | ORAL_TABLET | Freq: Every day | ORAL | Status: DC

## 2010-12-30 ENCOUNTER — Encounter: Payer: Self-pay | Admitting: Family Medicine

## 2011-05-05 ENCOUNTER — Ambulatory Visit (INDEPENDENT_AMBULATORY_CARE_PROVIDER_SITE_OTHER): Payer: BC Managed Care – PPO | Admitting: Family Medicine

## 2011-05-05 ENCOUNTER — Telehealth: Payer: Self-pay | Admitting: Family Medicine

## 2011-05-05 ENCOUNTER — Encounter: Payer: BC Managed Care – PPO | Admitting: Family Medicine

## 2011-05-05 ENCOUNTER — Encounter: Payer: Self-pay | Admitting: Family Medicine

## 2011-05-05 VITALS — BP 128/88 | HR 86 | Ht 66.0 in | Wt 266.0 lb

## 2011-05-05 DIAGNOSIS — I152 Hypertension secondary to endocrine disorders: Secondary | ICD-10-CM | POA: Insufficient documentation

## 2011-05-05 DIAGNOSIS — R05 Cough: Secondary | ICD-10-CM

## 2011-05-05 DIAGNOSIS — I10 Essential (primary) hypertension: Secondary | ICD-10-CM

## 2011-05-05 DIAGNOSIS — E1159 Type 2 diabetes mellitus with other circulatory complications: Secondary | ICD-10-CM | POA: Insufficient documentation

## 2011-05-05 DIAGNOSIS — T464X5A Adverse effect of angiotensin-converting-enzyme inhibitors, initial encounter: Secondary | ICD-10-CM

## 2011-05-05 DIAGNOSIS — T44905A Adverse effect of unspecified drugs primarily affecting the autonomic nervous system, initial encounter: Secondary | ICD-10-CM

## 2011-05-05 DIAGNOSIS — R059 Cough, unspecified: Secondary | ICD-10-CM

## 2011-05-05 DIAGNOSIS — D509 Iron deficiency anemia, unspecified: Secondary | ICD-10-CM

## 2011-05-05 DIAGNOSIS — Z23 Encounter for immunization: Secondary | ICD-10-CM

## 2011-05-05 DIAGNOSIS — R058 Other specified cough: Secondary | ICD-10-CM

## 2011-05-05 DIAGNOSIS — E559 Vitamin D deficiency, unspecified: Secondary | ICD-10-CM

## 2011-05-05 DIAGNOSIS — E890 Postprocedural hypothyroidism: Secondary | ICD-10-CM | POA: Insufficient documentation

## 2011-05-05 DIAGNOSIS — Z0000 Encounter for general adult medical examination without abnormal findings: Secondary | ICD-10-CM

## 2011-05-05 LAB — TSH: TSH: 0.023 u[IU]/mL — ABNORMAL LOW (ref 0.350–4.500)

## 2011-05-05 LAB — POCT URINALYSIS DIPSTICK
Glucose, UA: NEGATIVE
Ketones, UA: NEGATIVE
Leukocytes, UA: NEGATIVE
Spec Grav, UA: 1.02
Urobilinogen, UA: NEGATIVE

## 2011-05-05 LAB — COMPREHENSIVE METABOLIC PANEL
ALT: 16 U/L (ref 0–35)
AST: 15 U/L (ref 0–37)
Albumin: 4.3 g/dL (ref 3.5–5.2)
CO2: 24 mEq/L (ref 19–32)
Calcium: 9.3 mg/dL (ref 8.4–10.5)
Chloride: 106 mEq/L (ref 96–112)
Creat: 0.93 mg/dL (ref 0.50–1.10)
Potassium: 4 mEq/L (ref 3.5–5.3)
Total Protein: 7 g/dL (ref 6.0–8.3)

## 2011-05-05 LAB — LIPID PANEL
HDL: 45 mg/dL (ref >39)
Triglycerides: 50 mg/dL (ref <150)

## 2011-05-05 MED ORDER — LOSARTAN POTASSIUM-HCTZ 50-12.5 MG PO TABS: 1.0000 | ORAL_TABLET | Freq: Every day | ORAL | Status: DC

## 2011-05-05 NOTE — Telephone Encounter (Signed)
LEFT MESSAGE FOR PT NEEDS APPT

## 2011-05-05 NOTE — Progress Notes (Signed)
  Subjective:    Patient ID: Katie Aguirre, female    DOB: 04-04-1966, 45 y.o.   MRN: [id number]  HPI She is here for complete examination. She did stop taking her lisinopril due to cough. This occurred several months ago. She did not call concerning this. She continues on other medications listed in the chart. Her allergies seem to be under good control. She continues on iron supplementation as well as a multivitamin. She does have underlying PCOS and is being followed by her gynecologist. He continues on her thyroid medication. She does have a use history of thyroid cancer.   Review of Systems  negative except as above    Objective:   Physical Exam BP 128/88  Pulse 86  Ht 5\' 6"  (1.676 m)  Wt 266 lb (120.657 kg)  BMI 42.93 kg/m2  General Appearance:    Alert, cooperative, no distress, appears stated age  Head:    Normocephalic, without obvious abnormality, atraumatic  Eyes:    PERRL, conjunctiva/corneas clear, EOM's intact, fundi    benign  Ears:    Normal TM's and external ear canals  Nose:   Nares normal, mucosa normal, no drainage or sinus   tenderness  Throat:   Lips, mucosa, and tongue normal; teeth and gums normal  Neck:   Supple, no lymphadenopathy;  thyroid:  no   enlargement/tenderness/nodules; no carotid   bruit or JVD  Back:    Spine nontender, no curvature, ROM normal, no CVA     tenderness  Lungs:     Clear to auscultation bilaterally without wheezes, rales or     ronchi; respirations unlabored  Chest Wall:    No tenderness or deformity   Heart:    Regular rate and rhythm, S1 and S2 normal, no murmur, rub   or gallop  Breast Exam:    Deferred to GYN  Abdomen:     Soft, non-tender, nondistended, normoactive bowel sounds,    no masses, no hepatosplenomegaly  Genitalia:    Deferred to GYN     Extremities:   No clubbing, cyanosis or edema  Pulses:   2+ and symmetric all extremities  Skin:   Skin color, texture, turgor normal, no rashes or lesions  Lymph nodes:    Cervical, supraclavicular, and axillary nodes normal  Neurologic:   CNII-XII intact, normal strength, sensation and gait; reflexes 2+ and symmetric throughout          Psych:   Normal mood, affect, hygiene and grooming.           Assessment & Plan:   1. HTN (hypertension)  POCT Urinalysis Dipstick  2. ACE-inhibitor cough    3. Iron deficiency anemia    4. Vitamin D deficiency  Vitamin D 25 hydroxy  5. Obesity, Class III, BMI 40-49.9 (morbid obesity)  CBC with Differential, Comprehensive metabolic panel, Lipid panel  6. Postsurgical hypothyroidism  TSH  7. Routine general medical examination at a health care facility  CBC with Differential, Comprehensive metabolic panel, Lipid panel  8  allergic rhinitis 9  PCOS BLOOD SCREENING AS MENTIONED ABOVE. ALSO DISCUSSED THE NEED FOR HER TO MAKE DIET AND EXERCISE CHANGES. She was placed on Hyzaar. She is to return here in one month for recheck.

## 2011-05-06 LAB — CBC WITH DIFFERENTIAL/PLATELET
Basophils Absolute: 0 10*3/uL (ref 0.0–0.1)
Eosinophils Relative: 1 % (ref 0–5)
HCT: 36.5 % (ref 36.0–46.0)
Lymphocytes Relative: 26 % (ref 12–46)
Lymphs Abs: 1.3 10*3/uL (ref 0.7–4.0)
MCV: 78.5 fL (ref 78.0–100.0)
Neutro Abs: 3.3 10*3/uL (ref 1.7–7.7)
Platelets: 334 10*3/uL (ref 150–400)
RBC: 4.65 MIL/uL (ref 3.87–5.11)
WBC: 5.1 10*3/uL (ref 4.0–10.5)

## 2011-05-06 LAB — VITAMIN D 25 HYDROXY (VIT D DEFICIENCY, FRACTURES): Vit D, 25-Hydroxy: 25 ng/mL — ABNORMAL LOW (ref 30–89)

## 2011-05-13 ENCOUNTER — Ambulatory Visit (INDEPENDENT_AMBULATORY_CARE_PROVIDER_SITE_OTHER): Payer: BC Managed Care – PPO | Admitting: Medical

## 2011-05-13 ENCOUNTER — Encounter: Payer: Self-pay | Admitting: Medical

## 2011-05-13 VITALS — BP 140/80 | HR 80 | Temp 98.6°F | Wt 270.0 lb

## 2011-05-13 DIAGNOSIS — L723 Sebaceous cyst: Secondary | ICD-10-CM

## 2011-05-13 DIAGNOSIS — L089 Local infection of the skin and subcutaneous tissue, unspecified: Secondary | ICD-10-CM

## 2011-05-13 MED ORDER — DOXYCYCLINE HYCLATE 100 MG PO TABS: 100.0000 mg | ORAL_TABLET | Freq: Two times a day (BID) | ORAL | Status: AC

## 2011-05-13 NOTE — Patient Instructions (Signed)
Abscess An abscess (boil or furuncle) is an infected area that contains a collection of pus.  SYMPTOMS Signs and symptoms of an abscess include pain, tenderness, redness, or hardness. You may feel a moveable soft area under your skin. An abscess can occur anywhere in the body.  TREATMENT  A surgical cut (incision) may be made over your abscess to drain the pus. Gauze may be packed into the space or a drain may be looped through the abscess cavity (pocket). This provides a drain that will allow the cavity to heal from the inside outwards. The abscess may be painful for a few days, but should feel much better if it was drained.  Your abscess, if seen early, may not have localized and may not have been drained. If not, another appointment may be required if it does not get better on its own or with medications. HOME CARE INSTRUCTIONS   Only take over-the-counter or prescription medicines for pain, discomfort, or fever as directed by your caregiver.   Take your antibiotics as directed if they were prescribed. Finish them even if you start to feel better.   Keep the skin and clothes clean around your abscess.   If the abscess was drained, you will need to use gauze dressing to collect any draining pus. Dressings will typically need to be changed 3 or more times a day.   The infection may spread by skin contact with others. Avoid skin contact as much as possible.   Practice good hygiene. This includes regular hand washing, cover any draining skin lesions, and do not share personal care items.   If you participate in sports, do not share athletic equipment, towels, whirlpools, or personal care items. Shower after every practice or tournament.   If a draining area cannot be adequately covered:   Do not participate in sports.   Children should not participate in day care until the wound has healed or drainage stops.   If your caregiver has given you a follow-up appointment, it is very important  to keep that appointment. Not keeping the appointment could result in a much worse infection, chronic or permanent injury, pain, and disability. If there is any problem keeping the appointment, you must call back to this facility for assistance.  SEEK MEDICAL CARE IF:   You develop increased pain, swelling, redness, drainage, or bleeding in the wound site.   You develop signs of generalized infection including muscle aches, chills, fever, or a general ill feeling.   You have an oral temperature above 102 F (38.9 C).  MAKE SURE YOU:   Understand these instructions.   Will watch your condition.   Will get help right away if you are not doing well or get worse.  Document Released: 02/11/2005 Document Revised: 01/14/2011 Document Reviewed: 12/06/2007 [facility] Patient Information 2012 [place], [place].   Epidermal Cyst An epidermal cyst is sometimes called a sebaceous cyst, epidermal inclusion cyst, or infundibular cyst. These cysts usually contain a substance that looks "pasty" or "cheesy" and may have a bad smell. This substance is a protein called keratin. Epidermal cysts are usually found on the face, neck, or trunk. They may also occur in the vaginal area or other parts of the genitalia of both men and women. Epidermal cysts are usually small, painless, slow-growing bumps or lumps that move freely under the skin. It is important not to try to pop them. This may cause an infection and lead to tenderness and swelling. CAUSES  Epidermal cysts may be  caused by a deep penetrating injury to the skin or a plugged hair follicle, often associated with acne. SYMPTOMS  Epidermal cysts can become inflamed and cause:  Redness.   Tenderness.   Increased temperature of the skin over the bumps or lumps.   Grayish-white, bad smelling material that drains from the bump or lump.  DIAGNOSIS  Epidermal cysts are easily diagnosed by your caregiver during an exam. Rarely, a tissue sample (biopsy) may be  taken to rule out other conditions that may resemble epidermal cysts. TREATMENT   Epidermal cysts often get better and disappear on their own. They are rarely ever cancerous.   If a cyst becomes infected, it may become inflamed and tender. This may require opening and draining the cyst. Treatment with antibiotics may be necessary. When the infection is gone, the cyst may be removed with minor surgery.   Small, inflamed cysts can often be treated with antibiotics or by injecting steroid medicines.   Sometimes, epidermal cysts become large and bothersome. If this happens, surgical removal in your caregiver's office may be necessary.  HOME CARE INSTRUCTIONS  Only take over-the-counter or prescription medicines as directed by your caregiver.   Take your antibiotics as directed. Finish them even if you start to feel better.  SEEK MEDICAL CARE IF:   Your cyst becomes tender, red, or swollen.   Your condition is not improving or is getting worse.   You have any other questions or concerns.  MAKE SURE YOU:  Understand these instructions.   Will watch your condition.   Will get help right away if you are not doing well or get worse.  Document Released: 04/04/2004 Document Revised: 01/14/2011 Document Reviewed: 11/10/2010 [facility] Patient Information 2012 [place], [place].

## 2011-05-13 NOTE — Progress Notes (Signed)
Subjective:   HPI  Katie Aguirre is a 45 y.o. female who presents with c/o cyst. She notes cyst on her back for 20 years, but it became swollen, tender and painful in the last few days.   Draining foul clear fluid last few days.  No prior treatment for the cyst.  It has been unchanged up until last few days.  No other aggravating or relieving factors.    No other c/o.  The following portions of the patient's history were reviewed and updated as appropriate: allergies, current medications, past family history, past medical history, past social history, past surgical history and problem list.  Past Medical History  Diagnosis Date  . Hypertension   . Polycystic ovarian syndrome   . Vitamin D deficiency   . Anemia   . Hx of thyroid cancer   . Cancer   . Allergy     Review of Systems Constitutional: -fever, -chills, -sweats, -unexpected -weight change,-fatigue ENT: -runny nose, -ear pain, -sore throat Cardiology:  -chest pain, -palpitations, -edema Respiratory: -cough, -shortness of breath, -wheezing Gastroenterology: -abdominal pain, -nausea, -vomiting, -diarrhea, -constipation Hematology: -bleeding or bruising problems Musculoskeletal: -arthralgias, -myalgias, -joint swelling, -back pain Ophthalmology: -vision changes Urology: -dysuria, -difficulty urinating, -hematuria, -urinary frequency, -urgency Neurology: -headache, -weakness, -tingling, -numbness    Objective:   Physical Exam  Filed Vitals:   05/13/11 0903  BP: 140/80  Pulse: 80  Temp: 98.6 F (37 C)    General appearance: alert, no distress, WD/WN Skin: left mid back with raised tender tense area with central drainage pore, lesion approx 4cm diameter, indurated, but no erythema, warmth or fluctuance  Assessment and Plan :    Encounter Diagnosis  Name Primary?  . Infected sebaceous cyst Yes   Discussed risks/benefits of procedure.  Pt consented.  Used 3 cc of 1%lidocaine with epi for local anesthesia.   Incised with #11 blade scalpel.  Expressed 3 cc of cheesy and mucoid fluid, foul odor, no loculations with exam, irrigated with high pressure saline, packed with 4 cm of 1/4" iodoform gauze.   Script for Doxycycline.  Discussed wound care, recheck here in 2 days for packing removal.  Call sooner prn.

## 2011-05-15 ENCOUNTER — Ambulatory Visit (INDEPENDENT_AMBULATORY_CARE_PROVIDER_SITE_OTHER): Payer: BC Managed Care – PPO | Admitting: Medical

## 2011-05-15 ENCOUNTER — Encounter: Payer: Self-pay | Admitting: Medical

## 2011-05-15 VITALS — BP 140/88 | HR 60 | Temp 98.2°F | Wt 267.0 lb

## 2011-05-15 DIAGNOSIS — L089 Local infection of the skin and subcutaneous tissue, unspecified: Secondary | ICD-10-CM

## 2011-05-15 DIAGNOSIS — L723 Sebaceous cyst: Secondary | ICD-10-CM

## 2011-05-15 DIAGNOSIS — L729 Follicular cyst of the skin and subcutaneous tissue, unspecified: Secondary | ICD-10-CM

## 2011-05-15 NOTE — Progress Notes (Signed)
Subjective:   HPI  Katie Aguirre is a 45 y.o. female who presents for recheck on cyst.  I saw her 2 days ago for infected sebaceous cyst of her left mid back.  I performed an I&D at that time, use packing, and she is here today for packing removal.  She is taking her doxycycline.  Denies fever, pain, otherwise feels fine.  No other aggravating or relieving factors.    No other c/o.  The following portions of the patient's history were reviewed and updated as appropriate: allergies, current medications, past family history, past medical history, past social history, past surgical history and problem list.  Past Medical History  Diagnosis Date  . Hypertension   . Polycystic ovarian syndrome   . Vitamin D deficiency   . Anemia   . Hx of thyroid cancer   . Cancer   . Allergy     Review of Systems Constitutional: -fever, -chills, -sweats, -unexpected -weight change,-fatigue ENT: -runny nose, -ear pain, -sore throat Cardiology:  -chest pain, -palpitations, -edema Respiratory: -cough, -shortness of breath, -wheezing Gastroenterology: -abdominal pain, -nausea, -vomiting, -diarrhea, -constipation Hematology: -bleeding or bruising problems Musculoskeletal: -arthralgias, -myalgias, -joint swelling, -back pain Ophthalmology: -vision changes Urology: -dysuria, -difficulty urinating, -hematuria, -urinary frequency, -urgency Neurology: -headache, -weakness, -tingling, -numbness    Objective:   Physical Exam  Filed Vitals:   05/15/11 1536  BP: 140/88  Pulse: 60  Temp: 98.2 F (36.8 C)    General appearance: alert, no distress, WD/WN, black female Skin: Left mid back with somewhat raised, slightly indurated, mildly tender lesion with packing in place. Once packing removed, there seemed to be additional induration and cheesy material able to be expressed.  Assessment and Plan :    Encounter Diagnosis  Name Primary?  . Infected cyst of skin Yes   Used 3 cc of 1% lidocaine with  epi for local anesthesia, and incised the original incision a bit more, expressed approximately 2 cc of additional cheesy material, irrigated with high pressure saline, and repacked with 3 cm of half-inch iodoform packing.  Discussed wound care, advise she continue doxycycline, and return Monday for packing removal and recheck.

## 2011-05-18 ENCOUNTER — Encounter: Payer: Self-pay | Admitting: Medical

## 2011-05-18 ENCOUNTER — Ambulatory Visit (INDEPENDENT_AMBULATORY_CARE_PROVIDER_SITE_OTHER): Payer: BC Managed Care – PPO | Admitting: Medical

## 2011-05-18 VITALS — BP 150/90 | HR 80 | Temp 98.8°F | Ht 66.0 in | Wt 266.0 lb

## 2011-05-18 DIAGNOSIS — L723 Sebaceous cyst: Secondary | ICD-10-CM

## 2011-05-18 NOTE — Progress Notes (Signed)
Subjective:   HPI  Katie Aguirre is a 45 y.o. female who presents for recheck on cyst.  I saw her 3 days ago for recheck on same.  I had to repeat I & D at that time.  She is still taking her doxycycline.  Denies fever, pain, otherwise feels fine.  No other aggravating or relieving factors.    No other c/o.  The following portions of the patient's history were reviewed and updated as appropriate: allergies, current medications, past family history, past medical history, past social history, past surgical history and problem list.  Past Medical History  Diagnosis Date  . Hypertension   . Polycystic ovarian syndrome   . Vitamin D deficiency   . Anemia   . Hx of thyroid cancer   . Cancer   . Allergy     Review of Systems Constitutional: -fever, -chills, -sweats    Objective:   Physical Exam  Filed Vitals:   05/18/11 0923  BP: 150/90  Pulse: 80  Temp: 98.8 F (37.1 C)    General appearance: alert, no distress, WD/WN, black female Skin: Left mid back with somewhat raised, slightly indurated, mildly tender lesion with packing in place. Once packing removed, there seemed to be additional induration and cheesy material able to be expressed.  Assessment and Plan :    Encounter Diagnosis  Name Primary?  . Sebaceous cyst Yes   At this point we discussed options.  No additional packing today.  She will use Epsom salts soaks and warm compresses, finish doxycycline, advise if this continues to persist or if not healing completely, she will likely need to follow up with a general surgeon to get the entire cyst exercised given that it is so deep.  She will call and let me know if this is not completely resolving.

## 2011-05-29 ENCOUNTER — Other Ambulatory Visit: Payer: Self-pay | Admitting: Gynecology

## 2011-05-29 DIAGNOSIS — R7989 Other specified abnormal findings of blood chemistry: Secondary | ICD-10-CM

## 2011-06-03 ENCOUNTER — Ambulatory Visit
Admission: RE | Admit: 2011-06-03 | Discharge: 2011-06-03 | Disposition: A | Discharge status: Home / Self Care | Payer: BC Managed Care – PPO | Source: Ambulatory Visit | Attending: Gynecology | Admitting: Gynecology

## 2011-06-03 DIAGNOSIS — R7989 Other specified abnormal findings of blood chemistry: Secondary | ICD-10-CM

## 2011-06-03 MED ORDER — GADOBENATE DIMEGLUMINE 529 MG/ML IV SOLN
10.0000 mL | Freq: Once | INTRAVENOUS | Status: AC | PRN
  Administered 2011-06-03: 10 mL via INTRAVENOUS

## 2011-06-03 MED ORDER — GADOBENATE DIMEGLUMINE 529 MG/ML IV SOLN: 10.0000 mL | Freq: Once | INTRAVENOUS | Status: DC | PRN

## 2011-06-09 ENCOUNTER — Ambulatory Visit (INDEPENDENT_AMBULATORY_CARE_PROVIDER_SITE_OTHER): Payer: BC Managed Care – PPO | Admitting: Family Medicine

## 2011-06-09 ENCOUNTER — Encounter: Payer: Self-pay | Admitting: Family Medicine

## 2011-06-09 VITALS — BP 130/90 | HR 81 | Ht 64.0 in | Wt 259.0 lb

## 2011-06-09 DIAGNOSIS — I10 Essential (primary) hypertension: Secondary | ICD-10-CM

## 2011-06-09 DIAGNOSIS — D497 Neoplasm of unspecified behavior of endocrine glands and other parts of nervous system: Secondary | ICD-10-CM

## 2011-06-09 DIAGNOSIS — E039 Hypothyroidism, unspecified: Secondary | ICD-10-CM

## 2011-06-09 MED ORDER — LEVOTHYROXINE SODIUM 150 MCG PO TABS: 150.0000 ug | ORAL_TABLET | Freq: Every day | ORAL | Status: DC

## 2011-06-09 MED ORDER — VERAPAMIL HCL 240 MG PO TBCR: 240.0000 mg | EXTENDED_RELEASE_TABLET | Freq: Every day | ORAL | Status: DC

## 2011-06-09 NOTE — Progress Notes (Signed)
  Subjective:    Patient ID: Katie Aguirre, female    DOB: 04/26/1966, 46 y.o.   MRN: [id number]  HPI She is here today for a blood pressure recheck. Presently she is on Hyzaar and Calan. She is having no difficulty with this. She is being evaluated by her gynecologist for an abnormal mammogram and galactorrhea. Review of her record indicates a low TSH. This was repeated by her gynecologist and was again low. She also has questions about her weight. Presently she is apparently on a 1000-calorie per day diet.   Review of Systems     Objective:   Physical Exam Alert and in no distress otherwise not examined       Assessment & Plan:   1. Pituitary tumor   2. Hypertension   3. Hypothyroid   4. Obesity, Class III, BMI 40-49.9 (morbid obesity)    she will followup with her gynecologist. I will decrease her Synthroid to 150 mcg and recheck this in 2 months. Discussed weight loss with her in regard to diet and exercise. Recommended 1500-1800-calorie diet.

## 2011-06-09 NOTE — Patient Instructions (Signed)
Increase her calories to between 1500 and 1800 per day. Keep working with the exercise.

## 2011-06-16 ENCOUNTER — Telehealth: Payer: Self-pay | Admitting: Family Medicine

## 2011-06-16 NOTE — Telephone Encounter (Signed)
Dr .Susann Givens I believe the one a day is correct but not sure her tsh was low please advise chart on desk

## 2011-06-16 NOTE — Telephone Encounter (Signed)
She should be on double dosing of vitamin D but not the thyroid medicine. Make sure she is taking one thyroid pill per day and recheck her in 2 months

## 2011-06-16 NOTE — Telephone Encounter (Signed)
Pt informed

## 2011-08-14 LAB — HM MAMMOGRAPHY

## 2011-08-17 ENCOUNTER — Other Ambulatory Visit: Payer: Self-pay | Admitting: Radiology

## 2012-01-15 ENCOUNTER — Other Ambulatory Visit: Payer: Self-pay | Admitting: Gynecology

## 2012-01-15 DIAGNOSIS — D352 Benign neoplasm of pituitary gland: Secondary | ICD-10-CM

## 2012-01-26 ENCOUNTER — Inpatient Hospital Stay
Admission: RE | Admit: 2012-01-26 | Discharge: 2012-01-26 | Payer: BC Managed Care – PPO | Source: Ambulatory Visit | Attending: Gynecology | Admitting: Gynecology

## 2012-07-03 ENCOUNTER — Other Ambulatory Visit: Payer: Self-pay | Admitting: Family Medicine

## 2012-08-12 ENCOUNTER — Other Ambulatory Visit: Payer: Self-pay | Admitting: Family Medicine

## 2012-08-16 ENCOUNTER — Encounter: Payer: Self-pay | Admitting: Internal Medicine

## 2012-08-18 ENCOUNTER — Encounter: Payer: Self-pay | Admitting: Family Medicine

## 2012-08-18 ENCOUNTER — Ambulatory Visit (INDEPENDENT_AMBULATORY_CARE_PROVIDER_SITE_OTHER): Payer: BC Managed Care – PPO | Admitting: Family Medicine

## 2012-08-18 VITALS — BP 140/90 | HR 81 | Ht 65.5 in | Wt 266.0 lb

## 2012-08-18 DIAGNOSIS — I10 Essential (primary) hypertension: Secondary | ICD-10-CM

## 2012-08-18 DIAGNOSIS — J309 Allergic rhinitis, unspecified: Secondary | ICD-10-CM

## 2012-08-18 DIAGNOSIS — E890 Postprocedural hypothyroidism: Secondary | ICD-10-CM

## 2012-08-18 DIAGNOSIS — Z85850 Personal history of malignant neoplasm of thyroid: Secondary | ICD-10-CM

## 2012-08-18 DIAGNOSIS — D509 Iron deficiency anemia, unspecified: Secondary | ICD-10-CM

## 2012-08-18 DIAGNOSIS — Z0000 Encounter for general adult medical examination without abnormal findings: Secondary | ICD-10-CM

## 2012-08-18 DIAGNOSIS — E559 Vitamin D deficiency, unspecified: Secondary | ICD-10-CM

## 2012-08-18 LAB — COMPREHENSIVE METABOLIC PANEL
CO2: 29 mEq/L (ref 19–32)
Calcium: 10.2 mg/dL (ref 8.4–10.5)
Glucose, Bld: 93 mg/dL (ref 70–99)
Sodium: 138 mEq/L (ref 135–145)
Total Bilirubin: 1 mg/dL (ref 0.3–1.2)
Total Protein: 7.9 g/dL (ref 6.0–8.3)

## 2012-08-18 LAB — CBC WITH DIFFERENTIAL/PLATELET
Basophils Absolute: 0 10*3/uL (ref 0.0–0.1)
Eosinophils Relative: 2 % (ref 0–5)
Lymphocytes Relative: 40 % (ref 12–46)
Neutro Abs: 3.4 10*3/uL (ref 1.7–7.7)
Platelets: 322 10*3/uL (ref 150–400)
RDW: 16 % — ABNORMAL HIGH (ref 11.5–15.5)
WBC: 6.4 10*3/uL (ref 4.0–10.5)

## 2012-08-18 LAB — LIPID PANEL
HDL: 56 mg/dL (ref >39)
Triglycerides: 48 mg/dL (ref <150)

## 2012-08-18 MED ORDER — AMLODIPINE BESYLATE 10 MG PO TABS: 10.0000 mg | ORAL_TABLET | Freq: Every day | ORAL | Status: DC

## 2012-08-18 MED ORDER — LOSARTAN POTASSIUM-HCTZ 50-12.5 MG PO TABS: 1.0000 | ORAL_TABLET | Freq: Every day | ORAL | Status: DC

## 2012-08-18 MED ORDER — FLUTICASONE PROPIONATE 50 MCG/ACT NA SUSP: 2.0000 | Freq: Every day | NASAL | Status: DC

## 2012-08-18 NOTE — Progress Notes (Signed)
Subjective:    Patient ID: Katie Aguirre, female    DOB: 04-08-1966, 47 y.o.   MRN: [id number]  HPI She is here for a complete examination. She continues on Hyzaar and verapamil. She is having no difficulty with them. She also is continuing on her thyroid medication. She does have a previous history of thyroid cancer and has been on thyroid replacement since then. She also has a history of iron deficiency anemia as well as vitamin D deficiency. She has been on iron supplementation as well as 1000 units of vitamin D per day. She continues on Flonase for her allergies and is having no difficulty with them. Her work and children keep her quite busy. Family and social history was reviewed. She does see her gynecologist usually yearly. She has remote history of abnormal Pap but over the last several years has been normal. She also had a mammogram which did show a cyst.   Review of Systems  Constitutional: Negative.   HENT: Negative.   Eyes: Negative.   Respiratory: Negative.   Cardiovascular: Negative.   Gastrointestinal: Negative.   Endocrine: Negative.   Genitourinary: Negative.   Allergic/Immunologic: Negative.   Neurological: Negative.   Hematological: Negative.   Psychiatric/Behavioral: Negative.        Objective:   Physical Exam BP 140/90  Pulse 81  Ht 5' 5.5" (1.664 m)  Wt 266 lb (120.657 kg)  BMI 43.58 kg/m2  LMP 08/01/2012  General Appearance:    Alert, cooperative, no distress, appears stated age  Head:    Normocephalic, without obvious abnormality, atraumatic  Eyes:    PERRL, conjunctiva/corneas clear, EOM's intact, fundi    benign  Ears:    Normal TM's and external ear canals  Nose:   Nares normal, mucosa normal, no drainage or sinus   tenderness  Throat:   Lips, mucosa, and tongue normal; teeth and gums normal  Neck:   Supple, no lymphadenopathy;  thyroid:  no   enlargement/tenderness/nodules; no carotid   bruit or JVD  Back:    Spine nontender, no curvature,  ROM normal, no CVA     tenderness  Lungs:     Clear to auscultation bilaterally without wheezes, rales or     ronchi; respirations unlabored  Chest Wall:    No tenderness or deformity   Heart:    Regular rate and rhythm, S1 and S2 normal, no murmur, rub   or gallop  Breast Exam:    Deferred to GYN  Abdomen:     Soft, non-tender, nondistended, normoactive bowel sounds,    no masses, no hepatosplenomegaly  Genitalia:    Deferred to GYN     Extremities:   No clubbing, cyanosis or edema  Pulses:   2+ and symmetric all extremities  Skin:   Skin color, texture, turgor normal, no rashes or lesions  Lymph nodes:   Cervical, supraclavicular, and axillary nodes normal  Neurologic:   CNII-XII intact, normal strength, sensation and gait; reflexes 2+ and symmetric throughout          Psych:   Normal mood, affect, hygiene and grooming.          Assessment & Plan:  Routine general medical examination at a health care facility - Plan: CBC with Differential, Lipid panel, Comprehensive metabolic panel  HTN (hypertension) - Plan: losartan-hydrochlorothiazide (HYZAAR) 50-12.5 MG per tablet, amLODipine (NORVASC) 10 MG tablet  Obesity, Class III, BMI 40-49.9 (morbid obesity)  Iron deficiency anemia  Vitamin D deficiency - Plan: Vitamin D  25 hydroxy  Postsurgical hypothyroidism - Plan: TSH  History of thyroid cancer  Allergic rhinitis, mild - Plan: fluticasone (FLONASE) 50 MCG/ACT nasal spray discussed in detail her work schedule and made suggestions on working in a half an hour for walking every day. I will switch her to Norvasc since her blood pressure right now is borderline. Continue on all of her other medications.

## 2012-08-19 ENCOUNTER — Other Ambulatory Visit: Payer: Self-pay

## 2012-08-19 LAB — VITAMIN D 25 HYDROXY (VIT D DEFICIENCY, FRACTURES): Vit D, 25-Hydroxy: 26 ng/mL — ABNORMAL LOW (ref 30–89)

## 2012-08-19 MED ORDER — LEVOTHYROXINE SODIUM 175 MCG PO TABS: 175.0000 ug | ORAL_TABLET | Freq: Every day | ORAL | Status: DC

## 2012-08-19 NOTE — Telephone Encounter (Signed)
PT CALLED AND ADVISED OF MED CHANGE

## 2012-08-19 NOTE — Progress Notes (Signed)
Quick Note:  CALLED PT TO INFORM HER OF LABS PT VERBALIZED UNDERSTANDING SHE IS TAKING SYNTHROID DAILY SHE SAID THAT YOU HAD CHANGED HER DOSE AND SHE CALLED ABOUT IT AND WAS TOLD IT WAS CORECT ______

## 2012-11-28 ENCOUNTER — Other Ambulatory Visit: Payer: BC Managed Care – PPO

## 2012-11-28 DIAGNOSIS — Z85850 Personal history of malignant neoplasm of thyroid: Secondary | ICD-10-CM

## 2012-11-29 ENCOUNTER — Other Ambulatory Visit: Payer: Self-pay

## 2012-11-29 LAB — TSH: TSH: 8.644 u[IU]/mL — ABNORMAL HIGH (ref 0.350–4.500)

## 2012-11-29 MED ORDER — LEVOTHYROXINE SODIUM 200 MCG PO TABS: 200.0000 ug | ORAL_TABLET | Freq: Every day | ORAL | Status: DC

## 2012-11-29 NOTE — Progress Notes (Signed)
Quick Note:  CALLED PT CELL LEFT WORD FOR WORD MESSAGE Thyroid function still not at goal. Increase to .2mg  daily and repeat in 2 months ______

## 2012-11-29 NOTE — Telephone Encounter (Signed)
SENT IN NEW DOSE

## 2013-02-03 ENCOUNTER — Other Ambulatory Visit: Payer: BC Managed Care – PPO

## 2013-02-07 ENCOUNTER — Other Ambulatory Visit: Payer: BC Managed Care – PPO

## 2013-02-07 DIAGNOSIS — R7989 Other specified abnormal findings of blood chemistry: Secondary | ICD-10-CM

## 2013-02-08 NOTE — Progress Notes (Signed)
Quick Note:  CALLED PT TO INFORM HER OF LABS LEFT WORD FOR WORD Her thyroid function is now in the normal range. Have her continue on present dosing of 0.2mg . recheck in 6 months ______

## 2013-02-09 ENCOUNTER — Other Ambulatory Visit: Payer: Self-pay | Admitting: Family Medicine

## 2013-02-22 ENCOUNTER — Other Ambulatory Visit (INDEPENDENT_AMBULATORY_CARE_PROVIDER_SITE_OTHER): Payer: BC Managed Care – PPO

## 2013-02-22 DIAGNOSIS — Z23 Encounter for immunization: Secondary | ICD-10-CM

## 2013-03-15 ENCOUNTER — Other Ambulatory Visit: Payer: Self-pay | Admitting: Family Medicine

## 2013-04-17 ENCOUNTER — Other Ambulatory Visit: Payer: Self-pay | Admitting: Family Medicine

## 2013-07-02 LAB — HM PAP SMEAR: HM Pap smear: NORMAL

## 2013-08-24 ENCOUNTER — Other Ambulatory Visit: Payer: Self-pay | Admitting: Family Medicine

## 2013-10-05 ENCOUNTER — Telehealth: Payer: Self-pay | Admitting: Medical

## 2013-10-05 ENCOUNTER — Ambulatory Visit (INDEPENDENT_AMBULATORY_CARE_PROVIDER_SITE_OTHER): Payer: BC Managed Care – PPO | Admitting: Medical

## 2013-10-05 ENCOUNTER — Encounter: Payer: Self-pay | Admitting: Medical

## 2013-10-05 ENCOUNTER — Ambulatory Visit: Payer: BC Managed Care – PPO | Admitting: Family Medicine

## 2013-10-05 VITALS — BP 132/80 | HR 80 | Temp 98.3°F | Resp 18 | Wt 270.0 lb

## 2013-10-05 DIAGNOSIS — R42 Dizziness and giddiness: Secondary | ICD-10-CM

## 2013-10-05 DIAGNOSIS — J069 Acute upper respiratory infection, unspecified: Secondary | ICD-10-CM

## 2013-10-05 MED ORDER — AZITHROMYCIN 250 MG PO TABS: ORAL_TABLET | ORAL | Status: DC

## 2013-10-05 MED ORDER — MECLIZINE HCL 25 MG PO TABS: 25.0000 mg | ORAL_TABLET | Freq: Two times a day (BID) | ORAL | Status: DC

## 2013-10-05 NOTE — Patient Instructions (Signed)
  Thank you for giving me the opportunity to serve you today.    Your diagnosis today includes: Encounter Diagnoses  Name Primary?  [name] Upper respiratory infection Yes  . Dizziness and giddiness      Specific recommendations today include:  Begin meclizine twice daily for congestion and dizziness for at least a week  Increase your water intake  Use Flonase 1 spray per nostril twice daily for the next week or more  Use nasal saline flush to clear the sinuses of mucous  If much worse over the weekend and begin Z-Pak antibiotic  If worse or not improving  I have included other useful information below for your review.  Using Saline Nose Drops with Bulb Syringe A bulb syringe is used to clear your nose. You may use it when you have a stuffy nose, nasal congestion, sinus pressure, or sneezing.   SALINE SOLUTION You can buy nose drops at your local drug store. You can also make nose drops yourself. Mix 1 cup of water with  teaspoon of salt. Stir. Store this mixture at room temperature. Make a new batch daily.  USE THE BULB IN COMBINATION WITH SALINE NOSE DROPS  Squeeze the air out of the bulb before suctioning the saline mixture.  While still squeezing the bulb flat, place the tip of the bulb into the saline mixture.  Let air come back into the bulb.  This will suction up the saline mixture.  Gently flush one nostril at a time.  Salt water nose drops will then moisten your  congested nose and loosen secretions before suctioning.  Use the bulb syringe as directed below to suction.  USING THE BULB SYRINGE TO SUCTION  While still squeezing the bulb flat, place the tip of the bulb into a nostril. Let air come back into the bulb. The suction will pull snot out of the nose and into the bulb.  Repeat on the other nostril.  Squeeze syringe several times into a tissue.  CLEANING THE BULB SYRINGE  Clean the bulb syringe every day with hot soapy water.  Clean the inside of the  bulb by squeezing the bulb while the tip is in soapy water.  Rinse by squeezing the bulb while the tip is in clean hot water.  Store the bulb with the tip side down on paper towel.  HOME CARE INSTRUCTIONS   Use saline nose drops often to keep the nose open and not stuffy.  Throw away used salt water. Make a new solution every time.  Do not use the same solution and dropper for another person  If you do not prefer to use nasal saline flush, other options include nasal saline spray or the [employer], both of which are available over the counter at your pharmacy.

## 2013-10-05 NOTE — Telephone Encounter (Signed)
error 

## 2013-10-05 NOTE — Progress Notes (Signed)
Subjective:  Katie Aguirre is a 48 y.o. female who presents for possible sinus infection.  Symptoms include several days of sinus pressure, bad dizziness, headache, right ear pain, nausea, unable to blow out mucous.  Has allergies, been using Flonase some.  Denies fever, vomiting, SOB, wheezing.  Patient is a non-smoker.  Using flonase for symptoms.  Denies sick contacts.  No other aggravating or relieving factors.  No other c/o.  ROS as in subjective   Objective: Filed Vitals:   10/05/13 0956  BP: 132/80  Pulse: 80  Temp: 98.3 F (36.8 C)  Resp: 18    General appearance: Alert, WD/WN, no distress, eyes closed, appears dizzy/nauseated                             Skin: warm, no rash                           Head: +mild maxillary sinus tenderness,                            Eyes: conjunctiva normal, corneas clear, PERRLA                            Ears: pearly TMs, external ear canals normal                          Nose: septum midline, turbinates swollen, with erythema and no discharge             Mouth/throat: MMM, tongue normal, mild pharyngeal erythema                           Neck: supple, no adenopathy, no thyromegaly, nontender                          Heart: RRR, normal S1, S2, no murmurs                         Lungs: CTA bilaterally, no wheezes, rales, or rhonchi      Assessment and Plan:   Encounter Diagnoses  Name Primary?  [name] Upper respiratory infection Yes  . Dizziness and giddiness      Prescription given for Meclizine, increase water intake, increase Flonase to BID, Tylenol or Ibuprofen OTC for fever and malaise.  Discussed symptomatic relief, nasal saline flush, and call or return if worse or not improving in 2-3 days.  If way worse over the weekend as discussed, can begin Zpak.

## 2013-10-06 ENCOUNTER — Other Ambulatory Visit: Payer: Self-pay | Admitting: Family Medicine

## 2013-10-06 NOTE — Telephone Encounter (Signed)
PATIENT NEEDS TO SCHEDULE AN APPOINTMENT TO FOLLOW UP MEDICATIONS.

## 2013-10-20 ENCOUNTER — Other Ambulatory Visit: Payer: Self-pay | Admitting: Family Medicine

## 2013-12-04 ENCOUNTER — Other Ambulatory Visit: Payer: Self-pay | Admitting: Family Medicine

## 2013-12-22 ENCOUNTER — Other Ambulatory Visit: Payer: Self-pay | Admitting: Family Medicine

## 2014-01-02 ENCOUNTER — Encounter: Payer: Self-pay | Admitting: Medical

## 2014-01-02 ENCOUNTER — Ambulatory Visit (INDEPENDENT_AMBULATORY_CARE_PROVIDER_SITE_OTHER): Payer: BC Managed Care – PPO | Admitting: Medical

## 2014-01-02 VITALS — BP 130/88 | HR 72 | Resp 14 | Ht 66.0 in | Wt 274.0 lb

## 2014-01-02 DIAGNOSIS — E039 Hypothyroidism, unspecified: Secondary | ICD-10-CM

## 2014-01-02 DIAGNOSIS — Z23 Encounter for immunization: Secondary | ICD-10-CM

## 2014-01-02 DIAGNOSIS — E559 Vitamin D deficiency, unspecified: Secondary | ICD-10-CM

## 2014-01-02 DIAGNOSIS — I10 Essential (primary) hypertension: Secondary | ICD-10-CM

## 2014-01-02 DIAGNOSIS — D509 Iron deficiency anemia, unspecified: Secondary | ICD-10-CM

## 2014-01-02 LAB — CBC WITH DIFFERENTIAL/PLATELET
BASOS ABS: 0 10*3/uL (ref 0.0–0.1)
BASOS PCT: 0 % (ref 0–1)
EOS PCT: 2 % (ref 0–5)
Eosinophils Absolute: 0.1 10*3/uL (ref 0.0–0.7)
HCT: 35.8 % — ABNORMAL LOW (ref 36.0–46.0)
Hemoglobin: 11.4 g/dL — ABNORMAL LOW (ref 12.0–15.0)
LYMPHS PCT: 30 % (ref 12–46)
Lymphs Abs: 1.7 10*3/uL (ref 0.7–4.0)
MCH: 25.6 pg — ABNORMAL LOW (ref 26.0–34.0)
MCHC: 31.8 g/dL (ref 30.0–36.0)
MCV: 80.4 fL (ref 78.0–100.0)
Monocytes Absolute: 0.4 10*3/uL (ref 0.1–1.0)
Monocytes Relative: 7 % (ref 3–12)
Neutro Abs: 3.5 10*3/uL (ref 1.7–7.7)
Neutrophils Relative %: 61 % (ref 43–77)
PLATELETS: 308 10*3/uL (ref 150–400)
RBC: 4.45 MIL/uL (ref 3.87–5.11)
RDW: 15.8 % — AB (ref 11.5–15.5)
WBC: 5.7 10*3/uL (ref 4.0–10.5)

## 2014-01-02 LAB — T4, FREE: FREE T4: 1.89 ng/dL — AB (ref 0.80–1.80)

## 2014-01-02 LAB — COMPREHENSIVE METABOLIC PANEL
ALBUMIN: 4.1 g/dL (ref 3.5–5.2)
ALT: 15 U/L (ref 0–35)
AST: 14 U/L (ref 0–37)
Alkaline Phosphatase: 73 U/L (ref 39–117)
BILIRUBIN TOTAL: 0.9 mg/dL (ref 0.2–1.2)
BUN: 12 mg/dL (ref 6–23)
CO2: 26 mEq/L (ref 19–32)
CREATININE: 0.9 mg/dL (ref 0.50–1.10)
Calcium: 9.3 mg/dL (ref 8.4–10.5)
Chloride: 102 mEq/L (ref 96–112)
Glucose, Bld: 105 mg/dL — ABNORMAL HIGH (ref 70–99)
Potassium: 3.9 mEq/L (ref 3.5–5.3)
Sodium: 137 mEq/L (ref 135–145)
Total Protein: 7.1 g/dL (ref 6.0–8.3)

## 2014-01-02 LAB — TSH: TSH: 0.502 u[IU]/mL (ref 0.350–4.500)

## 2014-01-02 NOTE — Addendum Note (Signed)
Addended by: Louie Bun on: 01/02/2014 08:53 AM   Modules accepted: Orders

## 2014-01-02 NOTE — Progress Notes (Signed)
   Subjective:   Katie Aguirre is a 48 y.o. female presenting on 01/02/2014 with med check  HTN - compliant with medication without c/o.   Checks BP at home periodically.  Usually sees 130/90.  DBP often around 84-90.  Hypothyroidism - takes Levothryoxine 222mcg generic.   Been on this dose since last visit earlier in the year.  Anemia - takes iron some.  Denies any bleeding.   Last pap smear, Dr. Lowella Dell advise she had blood in urine, saw urologist, but no abnormality found.  No blood in the stool.   Feels fatigue some, but attributes this to poor rest.  Up late at nights.  Has been anemic for years.   Exercising - 2-3 times per week with walking.   Diet is good.   Mammogram and pap up to date.  No other complaint.  Review of Systems ROS as in subjective  Past Medical History  Diagnosis Date  . Hypertension   . Polycystic ovarian syndrome   . Vitamin D deficiency   . Anemia   . Hx of thyroid cancer   . Cancer   . Allergy   . Thyroid disease          Objective:    BP 130/88  Pulse 72  Resp 14  Ht 5\' 6"  (1.676 m)  Wt 274 lb (124.286 kg)  BMI 44.25 kg/m2  LMP 12/10/2013  General appearance: alert, no distress, WD/WN Oral cavity: MMM, no lesions Neck: supple, anterior surgical scar, no lymphadenopathy, no thyromegaly, no masses Heart: RRR, normal S1, S2, no murmurs Lungs: CTA bilaterally, no wheezes, rhonchi, or rales Pulses: 2+ symmetric, upper and lower extremities, normal cap refill Ext: no edema     Assessment: Encounter Diagnoses  Name Primary?  [name] Unspecified hypothyroidism Yes  . Essential hypertension, benign   . Anemia, iron deficiency   . Unspecified vitamin D deficiency   . Need for prophylactic vaccination and inoculation against influenza      Plan: We discussed her chronic issues, medications, labs today, increase exercise, eat a healthy low-fat  Counseled on the influenza virus vaccine.  Vaccine information sheet given.  Influenza  vaccine given after consent obtained.  Specific recommendations today include:  We are checking routine labs today  We may end up switching to name brand Synthroid thyroid medication pending labs.  Exercise preferably 5 days per week  Eat a healthy low-fat diet, drink plenty of water daily  We updated your flu shot today  If your labs still come back showing anemia, we will have you do stool cards x3 to check for blood in the stool  I recommend you take the vitamin D 1000 international units over-the-counter daily   Antonae was seen today for med check.  Diagnoses and associated orders for this visit:  Unspecified hypothyroidism - TSH - T4, free  Essential hypertension, benign - CBC with Differential - Comprehensive metabolic panel  Anemia, iron deficiency - CBC with Differential  Unspecified vitamin D deficiency  Need for prophylactic vaccination and inoculation against influenza    Return pending labs.

## 2014-01-02 NOTE — Progress Notes (Signed)
Patient ID: Katie Aguirre, female   DOB: April 18, 1966, 48 y.o.   MRN: [id number] Flu Vaccine given left deltoid IM per Dorothea Ogle, Graciella Freer LPN, LOT [id number] exp 2/95/28

## 2014-01-02 NOTE — Addendum Note (Signed)
Addended by: Louie Bun on: 01/02/2014 11:05 AM   Modules accepted: Orders

## 2014-01-02 NOTE — Patient Instructions (Signed)
  Thank you for giving me the opportunity to serve you today.    Your diagnosis today includes: Encounter Diagnoses  Name Primary?  [name] Unspecified hypothyroidism Yes  . Essential hypertension, benign   . Anemia, iron deficiency   . Unspecified vitamin D deficiency   . Need for prophylactic vaccination and inoculation against influenza      Specific recommendations today include:  We are checking routine labs today  We may end up switching to name brand Synthroid thyroid medication pending labs.  Exercise preferably 5 days per week  Eat a healthy low-fat diet, drink plenty of water daily  We updated your flu shot today  If your labs still come back showing anemia, we will have you do stool cards x3 to check for blood in the stool  I recommend you take the vitamin D 1000 international units over-the-counter daily  Return pending labs.

## 2014-01-03 ENCOUNTER — Other Ambulatory Visit: Payer: Self-pay | Admitting: Medical

## 2014-01-03 MED ORDER — SYNTHROID 175 MCG PO TABS: 175.0000 ug | ORAL_TABLET | Freq: Every day | ORAL | Status: DC

## 2014-02-04 ENCOUNTER — Other Ambulatory Visit: Payer: Self-pay | Admitting: Family Medicine

## 2014-02-19 ENCOUNTER — Telehealth: Payer: Self-pay | Admitting: Family Medicine

## 2014-02-19 NOTE — Telephone Encounter (Signed)
[facility] req refill Synthroid .175 mg

## 2014-02-20 ENCOUNTER — Telehealth: Payer: Self-pay | Admitting: Medical

## 2014-02-20 ENCOUNTER — Other Ambulatory Visit: Payer: Self-pay | Admitting: Medical

## 2014-02-20 MED ORDER — LEVOTHYROXINE SODIUM 175 MCG PO TABS: 175.0000 ug | ORAL_TABLET | Freq: Every day | ORAL | Status: DC

## 2014-02-20 NOTE — Telephone Encounter (Signed)
I changed it and sent Levothyroxine.

## 2014-02-20 NOTE — Telephone Encounter (Signed)
Patient is aware 

## 2014-03-19 LAB — HM MAMMOGRAPHY: HM Mammogram: NORMAL (ref 0–4)

## 2014-04-13 ENCOUNTER — Other Ambulatory Visit: Payer: Self-pay | Admitting: Family Medicine

## 2014-08-09 ENCOUNTER — Other Ambulatory Visit: Payer: Self-pay | Admitting: Medical

## 2014-08-14 ENCOUNTER — Encounter: Payer: Self-pay | Admitting: Family Medicine

## 2014-09-07 ENCOUNTER — Ambulatory Visit (INDEPENDENT_AMBULATORY_CARE_PROVIDER_SITE_OTHER): Payer: BLUE CROSS/BLUE SHIELD | Admitting: Family Medicine

## 2014-09-07 ENCOUNTER — Encounter: Payer: Self-pay | Admitting: Family Medicine

## 2014-09-07 VITALS — BP 150/88 | HR 82 | Ht 65.5 in | Wt 270.4 lb

## 2014-09-07 DIAGNOSIS — E66813 Obesity, class 3: Secondary | ICD-10-CM

## 2014-09-07 DIAGNOSIS — I10 Essential (primary) hypertension: Secondary | ICD-10-CM

## 2014-09-07 DIAGNOSIS — E559 Vitamin D deficiency, unspecified: Secondary | ICD-10-CM | POA: Diagnosis not present

## 2014-09-07 DIAGNOSIS — J301 Allergic rhinitis due to pollen: Secondary | ICD-10-CM

## 2014-09-07 DIAGNOSIS — E890 Postprocedural hypothyroidism: Secondary | ICD-10-CM | POA: Diagnosis not present

## 2014-09-07 DIAGNOSIS — Z0000 Encounter for general adult medical examination without abnormal findings: Secondary | ICD-10-CM | POA: Diagnosis not present

## 2014-09-07 DIAGNOSIS — D509 Iron deficiency anemia, unspecified: Secondary | ICD-10-CM

## 2014-09-07 DIAGNOSIS — R87619 Unspecified abnormal cytological findings in specimens from cervix uteri: Secondary | ICD-10-CM | POA: Diagnosis not present

## 2014-09-07 LAB — CBC WITH DIFFERENTIAL/PLATELET
BASOS ABS: 0.1 10*3/uL (ref 0.0–0.1)
BASOS PCT: 1 % (ref 0–1)
EOS PCT: 2 % (ref 0–5)
Eosinophils Absolute: 0.1 10*3/uL (ref 0.0–0.7)
HEMATOCRIT: 38.4 % (ref 36.0–46.0)
Hemoglobin: 12.4 g/dL (ref 12.0–15.0)
Lymphocytes Relative: 35 % (ref 12–46)
Lymphs Abs: 2.2 10*3/uL (ref 0.7–4.0)
MCH: 25.9 pg — AB (ref 26.0–34.0)
MCHC: 32.3 g/dL (ref 30.0–36.0)
MCV: 80.3 fL (ref 78.0–100.0)
MONO ABS: 0.6 10*3/uL (ref 0.1–1.0)
MONOS PCT: 9 % (ref 3–12)
MPV: 9.4 fL (ref 8.6–12.4)
NEUTROS ABS: 3.3 10*3/uL (ref 1.7–7.7)
Neutrophils Relative %: 53 % (ref 43–77)
PLATELETS: 254 10*3/uL (ref 150–400)
RBC: 4.78 MIL/uL (ref 3.87–5.11)
RDW: 15.2 % (ref 11.5–15.5)
WBC: 6.2 10*3/uL (ref 4.0–10.5)

## 2014-09-07 LAB — POCT URINALYSIS DIPSTICK
Bilirubin, UA: NEGATIVE
Glucose, UA: NEGATIVE
Ketones, UA: NEGATIVE
Leukocytes, UA: NEGATIVE
Nitrite, UA: NEGATIVE
Protein, UA: NEGATIVE
RBC UA: NEGATIVE
Spec Grav, UA: 1.02
UROBILINOGEN UA: NEGATIVE
pH, UA: 6

## 2014-09-07 LAB — COMPREHENSIVE METABOLIC PANEL
ALT: 17 U/L (ref 0–35)
AST: 16 U/L (ref 0–37)
Albumin: 4.2 g/dL (ref 3.5–5.2)
Alkaline Phosphatase: 64 U/L (ref 39–117)
BUN: 13 mg/dL (ref 6–23)
CO2: 23 meq/L (ref 19–32)
CREATININE: 0.94 mg/dL (ref 0.50–1.10)
Calcium: 9.5 mg/dL (ref 8.4–10.5)
Chloride: 101 mEq/L (ref 96–112)
Glucose, Bld: 84 mg/dL (ref 70–99)
Potassium: 3.8 mEq/L (ref 3.5–5.3)
Sodium: 137 mEq/L (ref 135–145)
Total Bilirubin: 1.1 mg/dL (ref 0.2–1.2)
Total Protein: 7.3 g/dL (ref 6.0–8.3)

## 2014-09-07 LAB — LIPID PANEL
Cholesterol: 149 mg/dL (ref 0–200)
HDL: 52 mg/dL (ref >=46)
LDL Cholesterol: 86 mg/dL (ref 0–99)
Total CHOL/HDL Ratio: 2.9 Ratio
Triglycerides: 54 mg/dL (ref <150)
VLDL: 11 mg/dL (ref 0–40)

## 2014-09-07 LAB — TSH: TSH: 0.664 u[IU]/mL (ref 0.350–4.500)

## 2014-09-07 NOTE — Progress Notes (Signed)
   Subjective:    Patient ID: Katie Aguirre, female    DOB: 09/15/1965, 49 y.o.   MRN: [id number]  HPI She is here for complete examination. She does have a history of abnormal Pap. She is being followed by gynecology for that. She will also get her mammogram there. She does exercise regularly but has not lost much weight. She does have a previous history of vitamin D deficiency. Her allergies seem to be under fairly good control. She does take blood pressure medication. She has a previous history of thyroid cancer and subsequent postsurgical hypothyroidism. She also has a previous history of iron deficiency anemia. She is on a multivitamin. Her work and home life are going well. Family and social history as well as health maintenance and immunizations were reviewed.   Review of Systems  All other systems reviewed and are negative.      Objective:   Physical Exam BP 150/88 mmHg  Pulse 82  Ht 5' 5.5" (1.664 m)  Wt 270 lb 6.4 oz (122.653 kg)  BMI 44.30 kg/m2  General Appearance:    Alert, cooperative, no distress, appears stated age  Head:    Normocephalic, without obvious abnormality, atraumatic  Eyes:    PERRL, conjunctiva/corneas clear, EOM's intact, fundi    benign  Ears:    Normal TM's and external ear canals  Nose:   Nares normal, mucosa normal, no drainage or sinus   tenderness  Throat:   Lips, mucosa, and tongue normal; teeth and gums normal  Neck:   Supple, no lymphadenopathy;  thyroid:  no   enlargement/tenderness/nodules; no carotid   bruit or JVD  Back:    Spine nontender, no curvature, ROM normal, no CVA     tenderness  Lungs:     Clear to auscultation bilaterally without wheezes, rales or     ronchi; respirations unlabored  Chest Wall:    No tenderness or deformity   Heart:    Regular rate and rhythm, S1 and S2 normal, no murmur, rub   or gallop  Breast Exam:    Deferred to GYN  Abdomen:     Soft, non-tender, nondistended, normoactive bowel sounds,    no masses,  no hepatosplenomegaly  Genitalia:    Deferred to GYN     Extremities:   No clubbing, cyanosis or edema  Pulses:   2+ and symmetric all extremities  Skin:   Skin color, texture, turgor normal, no rashes or lesions  Lymph nodes:   Cervical, supraclavicular, and axillary nodes normal  Neurologic:   CNII-XII intact, normal strength, sensation and gait; reflexes 2+ and symmetric throughout          Psych:   Normal mood, affect, hygiene and grooming.          Assessment & Plan:  Annual physical exam - Plan: POCT Urinalysis Dipstick, CBC with Differential/Platelet, Comprehensive metabolic panel, Lipid panel  Postsurgical hypothyroidism - Plan: TSH  Obesity, Class III, BMI 40-49.9 (morbid obesity) - Plan: Amb ref to Medical Nutrition Therapy-MNT  Iron deficiency anemia - Plan: CBC with Differential/Platelet  Essential hypertension - Plan: CBC with Differential/Platelet, Comprehensive metabolic panel  Allergic rhinitis due to pollen  Vitamin D deficiency - Plan: Vit D  25 hydroxy (rtn osteoporosis monitoring)  Abnormal cervical Papanicolaou smear, unspecified abnormal pap finding Encouraged her to be physically active. She will follow-up with her gynecologist. Follow-up on blood work pending results.

## 2014-09-08 LAB — VITAMIN D 25 HYDROXY (VIT D DEFICIENCY, FRACTURES): Vit D, 25-Hydroxy: 30 ng/mL (ref 30–100)

## 2014-09-09 ENCOUNTER — Other Ambulatory Visit: Payer: Self-pay | Admitting: Medical

## 2014-09-10 ENCOUNTER — Telehealth: Payer: Self-pay | Admitting: Internal Medicine

## 2014-09-10 MED ORDER — LEVOTHYROXINE SODIUM 175 MCG PO TABS: ORAL_TABLET | ORAL | Status: DC

## 2014-09-10 NOTE — Telephone Encounter (Signed)
Pt notified of labs. Refilled med for pt

## 2014-09-10 NOTE — Telephone Encounter (Signed)
-----   Message from Denita Lung, MD sent at 09/08/2014 10:01 AM EDT ----- The blood work looks good. Continue on present meds

## 2015-02-22 ENCOUNTER — Other Ambulatory Visit: Payer: Self-pay | Admitting: Family Medicine

## 2015-03-20 LAB — HM PAP SMEAR: HM PAP: NORMAL

## 2015-04-21 DIAGNOSIS — E039 Hypothyroidism, unspecified: Secondary | ICD-10-CM | POA: Insufficient documentation

## 2015-04-29 ENCOUNTER — Other Ambulatory Visit: Payer: Self-pay | Admitting: Family Medicine

## 2015-05-27 ENCOUNTER — Other Ambulatory Visit: Payer: Self-pay | Admitting: Medical

## 2015-05-27 ENCOUNTER — Other Ambulatory Visit: Payer: Self-pay | Admitting: Family Medicine

## 2015-09-05 ENCOUNTER — Other Ambulatory Visit: Payer: Self-pay | Admitting: Family Medicine

## 2015-11-05 ENCOUNTER — Other Ambulatory Visit: Payer: Self-pay | Admitting: Medical

## 2015-12-15 ENCOUNTER — Other Ambulatory Visit: Payer: Self-pay | Admitting: Family Medicine

## 2016-02-16 ENCOUNTER — Other Ambulatory Visit: Payer: Self-pay | Admitting: Family Medicine

## 2016-02-17 ENCOUNTER — Other Ambulatory Visit: Payer: Self-pay | Admitting: Family Medicine

## 2016-03-13 ENCOUNTER — Other Ambulatory Visit: Payer: Self-pay | Admitting: Family Medicine

## 2016-03-13 NOTE — Telephone Encounter (Signed)
Pt has an appt in November 

## 2016-03-20 ENCOUNTER — Encounter: Payer: Self-pay | Admitting: Family Medicine

## 2016-03-20 ENCOUNTER — Ambulatory Visit (INDEPENDENT_AMBULATORY_CARE_PROVIDER_SITE_OTHER): Payer: BLUE CROSS/BLUE SHIELD | Admitting: Family Medicine

## 2016-03-20 VITALS — BP 120/102 | HR 78 | Ht 66.0 in | Wt 275.0 lb

## 2016-03-20 DIAGNOSIS — I10 Essential (primary) hypertension: Secondary | ICD-10-CM

## 2016-03-20 DIAGNOSIS — E890 Postprocedural hypothyroidism: Secondary | ICD-10-CM

## 2016-03-20 DIAGNOSIS — Z1211 Encounter for screening for malignant neoplasm of colon: Secondary | ICD-10-CM

## 2016-03-20 DIAGNOSIS — Z1231 Encounter for screening mammogram for malignant neoplasm of breast: Secondary | ICD-10-CM | POA: Diagnosis not present

## 2016-03-20 DIAGNOSIS — J301 Allergic rhinitis due to pollen: Secondary | ICD-10-CM | POA: Diagnosis not present

## 2016-03-20 DIAGNOSIS — Z23 Encounter for immunization: Secondary | ICD-10-CM | POA: Diagnosis not present

## 2016-03-20 DIAGNOSIS — E559 Vitamin D deficiency, unspecified: Secondary | ICD-10-CM | POA: Diagnosis not present

## 2016-03-20 DIAGNOSIS — Z1239 Encounter for other screening for malignant neoplasm of breast: Secondary | ICD-10-CM

## 2016-03-20 DIAGNOSIS — D509 Iron deficiency anemia, unspecified: Secondary | ICD-10-CM | POA: Diagnosis not present

## 2016-03-20 DIAGNOSIS — Z0000 Encounter for general adult medical examination without abnormal findings: Secondary | ICD-10-CM

## 2016-03-20 LAB — CBC WITH DIFFERENTIAL/PLATELET
BASOS ABS: 0 {cells}/uL (ref 0–200)
Basophils Relative: 0 %
EOS PCT: 2 %
Eosinophils Absolute: 148 cells/uL (ref 15–500)
HEMATOCRIT: 38.3 % (ref 35.0–45.0)
HEMOGLOBIN: 12.4 g/dL (ref 11.7–15.5)
LYMPHS ABS: 2886 {cells}/uL (ref 850–3900)
Lymphocytes Relative: 39 %
MCH: 25.5 pg — AB (ref 27.0–33.0)
MCHC: 32.4 g/dL (ref 32.0–36.0)
MCV: 78.8 fL — ABNORMAL LOW (ref 80.0–100.0)
MONO ABS: 518 {cells}/uL (ref 200–950)
MPV: 9.6 fL (ref 7.5–12.5)
Monocytes Relative: 7 %
NEUTROS PCT: 52 %
Neutro Abs: 3848 cells/uL (ref 1500–7800)
Platelets: 325 10*3/uL (ref 140–400)
RBC: 4.86 MIL/uL (ref 3.80–5.10)
RDW: 15.9 % — ABNORMAL HIGH (ref 11.0–15.0)
WBC: 7.4 10*3/uL (ref 4.0–10.5)

## 2016-03-20 LAB — TSH: TSH: 1.28 m[IU]/L

## 2016-03-20 MED ORDER — AMLODIPINE BESYLATE 10 MG PO TABS: 10.0000 mg | ORAL_TABLET | Freq: Every day | ORAL | 3 refills | Status: DC

## 2016-03-20 MED ORDER — LOSARTAN POTASSIUM-HCTZ 50-12.5 MG PO TABS: 1.0000 | ORAL_TABLET | Freq: Every day | ORAL | 3 refills | Status: DC

## 2016-03-20 MED ORDER — LEVOTHYROXINE SODIUM 175 MCG PO TABS: ORAL_TABLET | ORAL | 3 refills | Status: DC

## 2016-03-20 NOTE — Progress Notes (Signed)
Subjective:    Patient ID: Katie Aguirre, female    DOB: Nov 09, 1965, 50 y.o.   MRN: [id number]  HPI She is here for complete examination. She is on thyroid replacement. Is doing well on this. She also has a history of iron deficiency anemia as well as vitamin D deficiency. She does take a multivitamin and an iron supplement.. Her allergies seem to be under good control. She is using Flonase. He continues on her blood pressure medications and is having no difficulty with that. She is about to turn 50. She has not had a mammogram recently. Her work and home life are going well. Family and social history as well as health maintenance and immunizations were reviewed.   Review of Systems  All other systems reviewed and are negative.      Objective:   Physical Exam BP (!) 120/102   Pulse 78   Ht 5\' 6"  (1.676 m)   Wt 275 lb (124.7 kg)   LMP 12/19/2015   BMI 44.39 kg/m   General Appearance:    Alert, cooperative, no distress, appears stated age  Head:    Normocephalic, without obvious abnormality, atraumatic  Eyes:    PERRL, conjunctiva/corneas clear, EOM's intact, fundi    benign  Ears:    Normal TM's and external ear canals  Nose:   Nares normal, mucosa normal, no drainage or sinus   tenderness  Throat:   Lips, mucosa, and tongue normal; teeth and gums normal  Neck:   Supple, no lymphadenopathy;  thyroid:  no   enlargement/tenderness/nodules; no carotid   bruit or JVD     Lungs:     Clear to auscultation bilaterally without wheezes, rales or     ronchi; respirations unlabored      Heart:    Regular rate and rhythm, S1 and S2 normal, no murmur, rub   or gallop     Abdomen:     Soft, non-tender, nondistended, normoactive bowel sounds,    no masses, no hepatosplenomegaly  Genitalia:    Deferred to GYN     Extremities:   No clubbing, cyanosis or edema  Pulses:   2+ and symmetric all extremities  Skin:   Skin color, texture, turgor normal, no rashes or lesions  Lymph nodes:    Cervical, supraclavicular, and axillary nodes normal  Neurologic:   CNII-XII intact, normal strength, sensation and gait; reflexes 2+ and symmetric throughout          Psych:   Normal mood, affect, hygiene and grooming.          Assessment & Plan:  Routine general medical examination at a health care facility - Plan: CBC with Differential/Platelet, Comprehensive metabolic panel, Lipid panel  Need for prophylactic vaccination and inoculation against influenza - Plan: Flu Vaccine QUAD 36+ mos IM  Screening for colon cancer - Plan: Ambulatory referral to Gastroenterology  Postsurgical hypothyroidism - Plan: levothyroxine (SYNTHROID, LEVOTHROID) 175 MCG tablet, TSH  Obesity, Class III, BMI 40-49.9 (morbid obesity) ([place]) - Plan: Amb ref to Medical Nutrition Therapy-MNT, CBC with Differential/Platelet, Comprehensive metabolic panel, Lipid panel  Iron deficiency anemia, unspecified iron deficiency anemia type - Plan: CBC with Differential/Platelet  Essential hypertension - Plan: losartan-hydrochlorothiazide (HYZAAR) 50-12.5 MG tablet, amLODipine (NORVASC) 10 MG tablet, CBC with Differential/Platelet, Comprehensive metabolic panel  Chronic allergic rhinitis due to pollen, unspecified seasonality  Vitamin D deficiency - Plan: VITAMIN D 25 Hydroxy (Vit-D Deficiency, Fractures)  Screening for breast cancer - Plan: MM DIGITAL SCREENING BILATERAL Her  medications were renewed. She admits to not taking medications regularly. I reinforced the need to take her blood pressure medicines on a regular basis. Also strongly encouraged regular physical activity with her. Will also refer her to nutritionist to help with her weight. She will be set up for a mammogram. Recommended 3-D mammography.

## 2016-03-21 LAB — COMPREHENSIVE METABOLIC PANEL
ALK PHOS: 73 U/L (ref 33–115)
ALT: 18 U/L (ref 6–29)
AST: 19 U/L (ref 10–35)
Albumin: 4.5 g/dL (ref 3.6–5.1)
BILIRUBIN TOTAL: 1.6 mg/dL — AB (ref 0.2–1.2)
BUN: 15 mg/dL (ref 7–25)
CO2: 24 mmol/L (ref 20–31)
CREATININE: 1.09 mg/dL (ref 0.50–1.10)
Calcium: 9.7 mg/dL (ref 8.6–10.2)
Chloride: 102 mmol/L (ref 98–110)
GLUCOSE: 92 mg/dL (ref 65–99)
POTASSIUM: 3.9 mmol/L (ref 3.5–5.3)
Sodium: 139 mmol/L (ref 135–146)
Total Protein: 7.8 g/dL (ref 6.1–8.1)

## 2016-03-21 LAB — LIPID PANEL
CHOL/HDL RATIO: 2.9 ratio (ref <=5.0)
CHOLESTEROL: 158 mg/dL (ref 125–200)
HDL: 54 mg/dL (ref >=46)
LDL Cholesterol: 91 mg/dL (ref <130)
Triglycerides: 63 mg/dL (ref <150)
VLDL: 13 mg/dL (ref <30)

## 2016-03-21 LAB — VITAMIN D 25 HYDROXY (VIT D DEFICIENCY, FRACTURES): VIT D 25 HYDROXY: 27 ng/mL — AB (ref 30–100)

## 2016-03-25 ENCOUNTER — Encounter: Payer: Self-pay | Admitting: Internal Medicine

## 2016-03-25 ENCOUNTER — Encounter: Payer: Self-pay | Admitting: Gastroenterology

## 2016-04-02 ENCOUNTER — Encounter: Payer: Self-pay | Admitting: Internal Medicine

## 2016-05-14 ENCOUNTER — Other Ambulatory Visit: Payer: Self-pay | Admitting: Family Medicine

## 2016-06-03 ENCOUNTER — Encounter: Payer: Self-pay | Admitting: Gastroenterology

## 2016-06-09 ENCOUNTER — Ambulatory Visit (AMBULATORY_SURGERY_CENTER): Payer: Self-pay | Admitting: *Deleted

## 2016-06-09 VITALS — Ht 65.0 in | Wt 280.6 lb

## 2016-06-09 DIAGNOSIS — Z1211 Encounter for screening for malignant neoplasm of colon: Secondary | ICD-10-CM

## 2016-06-09 MED ORDER — NA SULFATE-K SULFATE-MG SULF 17.5-3.13-1.6 GM/177ML PO SOLN: ORAL | 0 refills | Status: DC

## 2016-06-09 NOTE — Progress Notes (Signed)
No allergies to eggs or soy. No problems with anesthesia.  Pt given Emmi instructions for colonoscopy  No oxygen use  No diet drug use  

## 2016-06-10 ENCOUNTER — Encounter: Payer: Self-pay | Admitting: Gastroenterology

## 2016-06-12 ENCOUNTER — Ambulatory Visit
Admission: RE | Admit: 2016-06-12 | Discharge: 2016-06-12 | Disposition: A | Discharge status: Home / Self Care | Payer: 59 | Source: Ambulatory Visit | Attending: Family Medicine | Admitting: Family Medicine

## 2016-06-12 DIAGNOSIS — Z1231 Encounter for screening mammogram for malignant neoplasm of breast: Secondary | ICD-10-CM | POA: Diagnosis not present

## 2016-06-15 ENCOUNTER — Telehealth: Payer: Self-pay | Admitting: Gastroenterology

## 2016-06-15 NOTE — Telephone Encounter (Signed)
Patient canceled colon procedure for Wednesday 06/17/16 due to getting a pre cert letter on Saturday stating her ins will not cover this procedure. Do you want to chare late fee?

## 2016-06-17 ENCOUNTER — Ambulatory Visit: Payer: Self-pay | Admitting: Gastroenterology

## 2016-06-18 NOTE — Telephone Encounter (Signed)
Not this time.  Yes if happens again.

## 2016-11-15 DIAGNOSIS — L309 Dermatitis, unspecified: Secondary | ICD-10-CM | POA: Diagnosis not present

## 2017-01-28 ENCOUNTER — Other Ambulatory Visit: Payer: Self-pay | Admitting: Family Medicine

## 2017-01-28 DIAGNOSIS — E890 Postprocedural hypothyroidism: Secondary | ICD-10-CM

## 2017-01-28 NOTE — Telephone Encounter (Signed)
Scheduled CPE for 11/14 with pt. Advised will call in enough medication to last until this appt.

## 2017-03-31 ENCOUNTER — Ambulatory Visit (INDEPENDENT_AMBULATORY_CARE_PROVIDER_SITE_OTHER): Payer: 59 | Admitting: Family Medicine

## 2017-03-31 ENCOUNTER — Encounter: Payer: Self-pay | Admitting: Family Medicine

## 2017-03-31 VITALS — BP 140/70 | HR 83 | Resp 16 | Ht 65.0 in | Wt 282.8 lb

## 2017-03-31 DIAGNOSIS — E559 Vitamin D deficiency, unspecified: Secondary | ICD-10-CM

## 2017-03-31 DIAGNOSIS — I10 Essential (primary) hypertension: Secondary | ICD-10-CM | POA: Diagnosis not present

## 2017-03-31 DIAGNOSIS — Z0000 Encounter for general adult medical examination without abnormal findings: Secondary | ICD-10-CM | POA: Diagnosis not present

## 2017-03-31 DIAGNOSIS — D509 Iron deficiency anemia, unspecified: Secondary | ICD-10-CM | POA: Diagnosis not present

## 2017-03-31 DIAGNOSIS — E890 Postprocedural hypothyroidism: Secondary | ICD-10-CM | POA: Diagnosis not present

## 2017-03-31 DIAGNOSIS — Z23 Encounter for immunization: Secondary | ICD-10-CM | POA: Diagnosis not present

## 2017-03-31 DIAGNOSIS — Z1211 Encounter for screening for malignant neoplasm of colon: Secondary | ICD-10-CM

## 2017-03-31 LAB — POCT URINALYSIS DIP (PROADVANTAGE DEVICE)
BILIRUBIN UA: NEGATIVE mg/dL
Bilirubin, UA: NEGATIVE
GLUCOSE UA: NEGATIVE mg/dL
Leukocytes, UA: NEGATIVE
NITRITE UA: NEGATIVE
Protein Ur, POC: NEGATIVE mg/dL
RBC UA: NEGATIVE
Specific Gravity, Urine: 1.025
Urobilinogen, Ur: NEGATIVE
pH, UA: 6 (ref 5.0–8.0)

## 2017-03-31 MED ORDER — AMLODIPINE BESYLATE 10 MG PO TABS: 10.0000 mg | ORAL_TABLET | Freq: Every day | ORAL | 3 refills | Status: DC

## 2017-03-31 MED ORDER — LEVOTHYROXINE SODIUM 175 MCG PO TABS: ORAL_TABLET | ORAL | 3 refills | Status: DC

## 2017-03-31 MED ORDER — LOSARTAN POTASSIUM-HCTZ 50-12.5 MG PO TABS: 1.0000 | ORAL_TABLET | Freq: Every day | ORAL | 3 refills | Status: DC

## 2017-03-31 NOTE — Progress Notes (Signed)
Subjective:    Patient ID: Katie Aguirre, female    DOB: June 10, 1965, 51 y.o.   MRN: [id number]  HPI She is here for complete examination.  She does have a history of vitamin D deficiency as well as postsurgical hypothyroidism.  Presently she is on thyroid replacement.  She continues on her blood pressure medications and is having no difficulty with that.  She also has a history of iron deficiency anemia and does use iron.  She is also over age 3 and will need colonoscopy.  She has had a mammogram recently.  Pap smear was a little over a year ago. Her work and home life are going well.  Family and social history as well as health maintenance and immunizations was reviewed  Review of Systems  All other systems reviewed and are negative.      Objective:   Physical Exam BP 140/70   Pulse 83   Resp 16   Ht 5\' 5"  (1.651 m)   Wt 282 lb 12.8 oz (128.3 kg)   LMP 12/17/2016   SpO2 98%   BMI 47.06 kg/m   General Appearance:    Alert, cooperative, no distress, appears stated age  Head:    Normocephalic, without obvious abnormality, atraumatic  Eyes:    PERRL, conjunctiva/corneas clear, EOM's intact, fundi    benign  Ears:    Normal TM's and external ear canals  Nose:   Nares normal, mucosa normal, no drainage or sinus   tenderness  Throat:   Lips, mucosa, and tongue normal; teeth and gums normal  Neck:   Supple, no lymphadenopathy;  thyroid:  no   enlargement/tenderness/nodules; no carotid   bruit or JVD  Back:    Spine nontender, no curvature, ROM normal, no CVA     tenderness  Lungs:     Clear to auscultation bilaterally without wheezes, rales or     ronchi; respirations unlabored  Chest Wall:    No tenderness or deformity   Heart:    Regular rate and rhythm, S1 and S2 normal, no murmur, rub   or gallop  Breast Exam:    Deferred to GYN  Abdomen:     Soft, non-tender, nondistended, normoactive bowel sounds,    no masses, no hepatosplenomegaly  Genitalia:    Deferred to GYN    Extremities:   No clubbing, cyanosis or edema  Pulses:   2+ and symmetric all extremities  Skin:   Skin color, texture, turgor normal, no rashes or lesions  Lymph nodes:   Cervical, supraclavicular, and axillary nodes normal  Neurologic:   CNII-XII intact, normal strength, sensation and gait; reflexes 2+ and symmetric throughout          Psych:   Normal mood, affect, hygiene and grooming.          Assessment & Plan:  Annual physical exam - Plan: POCT Urinalysis DIP (Proadvantage Device), CBC with Differential/Platelet, Comprehensive metabolic panel, Lipid panel  Vitamin D deficiency - Plan: VITAMIN D 25 Hydroxy (Vit-D Deficiency, Fractures)  Postsurgical hypothyroidism - Plan: TSH, levothyroxine (SYNTHROID, LEVOTHROID) 175 MCG tablet  Essential hypertension - Plan: CBC with Differential/Platelet, Comprehensive metabolic panel, losartan-hydrochlorothiazide (HYZAAR) 50-12.5 MG tablet, amLODipine (NORVASC) 10 MG tablet  Iron deficiency anemia, unspecified iron deficiency anemia type  Need for influenza vaccination - Plan: Flu Vaccine QUAD 6+ mos PF IM (Fluarix Quad PF)  Obesity, Class III, BMI 40-49.9 (morbid obesity) ([facility]) - Plan: CBC with Differential/Platelet, Comprehensive metabolic panel, Lipid panel  Screening for  colon cancer - Plan: Cologuard I would do routine blood screening.  Her immunizations were updated. I again discussed her weight in regard to lifestyle.  Discussed diet and exercise with her as well as cutting back on carbohydrates.  Discussed going to a nutritionist but at the present time she will make an attempt to increase her physical activity as well as decreasing her carbohydrates.

## 2017-03-31 NOTE — Patient Instructions (Signed)
20 minutes daily of something physical Cut back on white food

## 2017-04-01 LAB — CBC WITH DIFFERENTIAL/PLATELET
BASOS PCT: 0.4 %
Basophils Absolute: 23 cells/uL (ref 0–200)
EOS ABS: 80 {cells}/uL (ref 15–500)
EOS PCT: 1.4 %
HCT: 37.5 % (ref 35.0–45.0)
HEMOGLOBIN: 12.1 g/dL (ref 11.7–15.5)
Lymphs Abs: 1864 cells/uL (ref 850–3900)
MCH: 25.4 pg — ABNORMAL LOW (ref 27.0–33.0)
MCHC: 32.3 g/dL (ref 32.0–36.0)
MCV: 78.8 fL — ABNORMAL LOW (ref 80.0–100.0)
MPV: 9.8 fL (ref 7.5–12.5)
Monocytes Relative: 7.5 %
Neutro Abs: 3306 cells/uL (ref 1500–7800)
Neutrophils Relative %: 58 %
Platelets: 326 10*3/uL (ref 140–400)
RBC: 4.76 10*6/uL (ref 3.80–5.10)
RDW: 13.6 % (ref 11.0–15.0)
TOTAL LYMPHOCYTE: 32.7 %
WBC mixed population: 428 cells/uL (ref 200–950)
WBC: 5.7 10*3/uL (ref 3.8–10.8)

## 2017-04-01 LAB — COMPREHENSIVE METABOLIC PANEL
AG Ratio: 1.5 (calc) (ref 1.0–2.5)
ALKALINE PHOSPHATASE (APISO): 80 U/L (ref 33–130)
ALT: 14 U/L (ref 6–29)
AST: 13 U/L (ref 10–35)
Albumin: 4.2 g/dL (ref 3.6–5.1)
BILIRUBIN TOTAL: 1 mg/dL (ref 0.2–1.2)
BUN: 11 mg/dL (ref 7–25)
CALCIUM: 9.1 mg/dL (ref 8.6–10.4)
CO2: 26 mmol/L (ref 20–32)
Chloride: 104 mmol/L (ref 98–110)
Creat: 1.02 mg/dL (ref 0.50–1.05)
Globulin: 2.8 g/dL (calc) (ref 1.9–3.7)
Glucose, Bld: 102 mg/dL — ABNORMAL HIGH (ref 65–99)
POTASSIUM: 3.5 mmol/L (ref 3.5–5.3)
Sodium: 140 mmol/L (ref 135–146)
Total Protein: 7 g/dL (ref 6.1–8.1)

## 2017-04-01 LAB — LIPID PANEL
CHOLESTEROL: 168 mg/dL (ref <200)
HDL: 54 mg/dL (ref >50)
LDL CHOLESTEROL (CALC): 98 mg/dL
Non-HDL Cholesterol (Calc): 114 mg/dL (calc) (ref <130)
TRIGLYCERIDES: 69 mg/dL (ref <150)
Total CHOL/HDL Ratio: 3.1 (calc) (ref <5.0)

## 2017-04-01 LAB — TSH: TSH: 14.57 mIU/L — ABNORMAL HIGH

## 2017-04-01 LAB — VITAMIN D 25 HYDROXY (VIT D DEFICIENCY, FRACTURES): VIT D 25 HYDROXY: 27 ng/mL — AB (ref 30–100)

## 2017-05-23 DIAGNOSIS — Z1212 Encounter for screening for malignant neoplasm of rectum: Secondary | ICD-10-CM | POA: Diagnosis not present

## 2017-05-23 DIAGNOSIS — Z1211 Encounter for screening for malignant neoplasm of colon: Secondary | ICD-10-CM | POA: Diagnosis not present

## 2017-05-31 LAB — COLOGUARD: COLOGUARD: NEGATIVE

## 2017-06-05 ENCOUNTER — Other Ambulatory Visit: Payer: Self-pay | Admitting: Family Medicine

## 2017-06-16 ENCOUNTER — Other Ambulatory Visit (INDEPENDENT_AMBULATORY_CARE_PROVIDER_SITE_OTHER): Payer: 59

## 2017-06-16 DIAGNOSIS — R7989 Other specified abnormal findings of blood chemistry: Secondary | ICD-10-CM

## 2017-06-17 LAB — VITAMIN D 25 HYDROXY (VIT D DEFICIENCY, FRACTURES): VIT D 25 HYDROXY: 36.4 ng/mL (ref 30.0–100.0)

## 2017-06-17 LAB — TSH: TSH: 0.254 u[IU]/mL — AB (ref 0.450–4.500)

## 2017-07-13 ENCOUNTER — Encounter: Payer: Self-pay | Admitting: Family Medicine

## 2017-07-13 ENCOUNTER — Ambulatory Visit: Payer: 59 | Admitting: Family Medicine

## 2017-07-13 VITALS — BP 128/82 | HR 90 | Temp 98.6°F | Resp 16 | Wt 281.8 lb

## 2017-07-13 DIAGNOSIS — J0110 Acute frontal sinusitis, unspecified: Secondary | ICD-10-CM

## 2017-07-13 DIAGNOSIS — J209 Acute bronchitis, unspecified: Secondary | ICD-10-CM

## 2017-07-13 DIAGNOSIS — R062 Wheezing: Secondary | ICD-10-CM | POA: Diagnosis not present

## 2017-07-13 MED ORDER — PREDNISONE 10 MG (21) PO TBPK: ORAL_TABLET | Freq: Every day | ORAL | 0 refills | Status: DC

## 2017-07-13 MED ORDER — BENZONATATE 200 MG PO CAPS: 200.0000 mg | ORAL_CAPSULE | Freq: Two times a day (BID) | ORAL | 0 refills | Status: DC | PRN

## 2017-07-13 MED ORDER — AZITHROMYCIN 250 MG PO TABS: ORAL_TABLET | ORAL | 0 refills | Status: DC

## 2017-07-13 NOTE — Progress Notes (Signed)
Chief Complaint  Patient presents with  . other    whezzing, headaches , body pain, fever but none since sunday, cough some time producing yellow mucus . this has been going on since thurs. of last week    Subjective:  Katie Aguirre is a 52 y.o. female who presents for a 6 day history of frontal headache, sinus pressure, dry cough with occasional thick yellow sputum. States she was wheezing the past couple of nights and occasionally when she laughs or talks.   She initially felt feverish along with chills and body aches but these symptoms have resolved. States she started feeling better over the weekend but 2 days ago her symptoms worsened.  Thinks she may have had flu last week but did not get tested.  Does not smoke.  Reports history of pneumonia, bronchitis. No asthma.   Denies sore throat, chest pain, palpitations, shortness of breath, abdominal pain, N/V/D.   Treatment to date: Aleve, advil, nasal sprays, Mucinex, Nyquil.  Denies sick contacts.  No other aggravating or relieving factors.  No other c/o.  ROS as in subjective.   Objective: Vitals:   07/13/17 1421  BP: 128/82  Pulse: 90  Temp: 98.6 F (37 C)  SpO2: 99%    General appearance: Alert, WD/WN, no distress, mildly ill appearing                             Skin: warm, no rash                           Head: frontal sinus tenderness                            Eyes: conjunctiva normal, corneas clear, PERRLA                            Ears: pearly TMs, external ear canals normal                          Nose: septum midline, turbinates swollen, with erythema and thick discharge             Mouth/throat: MMM, tongue normal, mild pharyngeal erythema                           Neck: supple, anterior cervical adenopathy with tenderness, no thyromegaly                          Heart: RRR, normal S1, S2, no murmurs                         Lungs: +bronchial breath sounds, scattered rhonchi, distant wheezes       Assessment: Acute bronchitis, unspecified organism - Plan: azithromycin (ZITHROMAX Z-PAK) 250 MG tablet, benzonatate (TESSALON) 200 MG capsule, predniSONE (STERAPRED UNI-PAK 21 TAB) 10 MG (21) TBPK tablet  Wheezing - Plan: azithromycin (ZITHROMAX Z-PAK) 250 MG tablet  Acute frontal sinusitis, recurrence not specified - Plan: azithromycin (ZITHROMAX Z-PAK) 250 MG tablet    Plan: Discussed diagnosis and treatment of sinusitis and acute bronchitis.  No respiratory distress.  Z-Pak and prednisone Dosepak prescribed.  Tessalon Perles for cough.  Suggested symptomatic OTC remedies.Nasal saline  spray for congestion.  Tylenol or Ibuprofen OTC for fever and malaise.  Call/return if symptoms worsen or if she is not back to baseline after completing the medication.  She is aware that the Z-Pak does not work for 10 days.  Discussed potential side effects of steroids.

## 2017-07-13 NOTE — Patient Instructions (Signed)
Take the antibiotic and steroid dose pak as prescribed. Stay well hydrated. Try the Tessalon for cough.   Follow up if you are not back to baseline after completing the medications (Z-pak works for 10 days).

## 2017-07-14 ENCOUNTER — Telehealth: Payer: Self-pay | Admitting: Family Medicine

## 2017-07-14 NOTE — Telephone Encounter (Signed)
Called pt and she has been continuing to take Tylenol and Ibuprofen for body aches so she is not sure if her fever has gone. She actually feels worse again and has only had the energy to sleep today. Her husband was diagnosed with the flu today as well. Pt wants to know if she can just get an out of work note for the rest of the week and include in the note that she can return to work on Monday. Is this ok?

## 2017-07-14 NOTE — Telephone Encounter (Signed)
I am okay with her going back to work as long as she has not had fever for at least 24 hours without using Tylenol or ibuprofen.  We can give her a note

## 2017-07-14 NOTE — Telephone Encounter (Signed)
Pt would like to return to work tomorrow however she will need a note stating that she can return to work and that she is not contagious. Is this ok? Call pt back on her cell#

## 2017-07-14 NOTE — Telephone Encounter (Signed)
Ok to be off tomorrow and Friday and then if she needs more time I will have to see her.

## 2018-02-09 DIAGNOSIS — Z23 Encounter for immunization: Secondary | ICD-10-CM | POA: Diagnosis not present

## 2018-02-14 ENCOUNTER — Encounter: Payer: Self-pay | Admitting: Medical

## 2018-02-14 ENCOUNTER — Ambulatory Visit: Payer: 59 | Admitting: Medical

## 2018-02-14 VITALS — BP 160/110 | HR 89 | Temp 97.9°F | Resp 16 | Ht 65.0 in | Wt 286.2 lb

## 2018-02-14 DIAGNOSIS — E890 Postprocedural hypothyroidism: Secondary | ICD-10-CM

## 2018-02-14 DIAGNOSIS — R454 Irritability and anger: Secondary | ICD-10-CM | POA: Diagnosis not present

## 2018-02-14 DIAGNOSIS — N951 Menopausal and female climacteric states: Secondary | ICD-10-CM | POA: Diagnosis not present

## 2018-02-14 DIAGNOSIS — R4586 Emotional lability: Secondary | ICD-10-CM

## 2018-02-14 DIAGNOSIS — I10 Essential (primary) hypertension: Secondary | ICD-10-CM | POA: Diagnosis not present

## 2018-02-14 NOTE — Progress Notes (Signed)
Subjective: Chief Complaint  Patient presents with  . mood swings    feeling overwhelmed at times getting worse, anxiety X April 2019     Here for mood issues.  Sees Dr. Redmond School here for PCP.  Has been dealing with mood issues for months.   No prior treatment for depression or mental health issues.   No family hx/o depression or other mental health problems.     Patience is brief, if something is bothering her, will yell at times, having mood swings.   Feels down at times, but wouldn't say depressed.   Worries about a lot of things.   No suicidal or homicidal ideation.  Lives with husband and 2 children, 24yo and 53yo.   Working full time, happy at work, but gets these symptom at work.  Home life is fine.    LMP around 03/2017.   Having some hot flashes at night.   Sleep is ok sometimes, off and on.     Denies anhedonia.   She notes she enjoys her job.   Takes a vacation maybe twice yearly.  Takes some me time once every 3 months.    Compliant with medication for thyroid and BP.    Past Medical History:  Diagnosis Date  . Allergy   . Anemia   . Cancer [facility]) 2004   thyroid cancer  . Hx of thyroid cancer   . Hypertension   . Polycystic ovarian syndrome   . Thyroid disease   . Vitamin D deficiency    Current Outpatient Medications on File Prior to Visit  Medication Sig Dispense Refill  . amLODipine (NORVASC) 10 MG tablet Take 1 tablet (10 mg total) daily by mouth. 90 tablet 3  . cholecalciferol (VITAMIN D) 1000 UNITS tablet Take 2,000 Units by mouth daily.     . fluticasone (FLONASE) 50 MCG/ACT nasal spray INSTILL 2 SPRAYS IN EACH NOSTRIL 16 g 0  . levothyroxine (SYNTHROID, LEVOTHROID) 175 MCG tablet TAKE 1 TABLET BY MOUTH EVERY DAY BEFORE BREAKFAST 90 tablet 3  . losartan-hydrochlorothiazide (HYZAAR) 50-12.5 MG tablet Take 1 tablet daily by mouth. 90 tablet 3  . Polyethyl Glycol-Propyl Glycol (SYSTANE ULTRA OP) Apply 2-4 drops to eye daily.    . Ascorbic Acid (VITA-C PO) Take  500 mg by mouth daily.    . Ferrous Sulfate (IRON) 28 MG TABS Take 56 mg by mouth daily.    . Multiple Vitamins-Minerals (MULTIVITAMIN WITH MINERALS) tablet Take 1 tablet by mouth daily.     No current facility-administered medications on file prior to visit.    ROS as in subjective   Objective: BP (!) 160/110   Pulse 89   Temp 97.9 F (36.6 C) (Oral)   Resp 16   Ht 5\' 5"  (1.651 m)   Wt 286 lb 3.2 oz (129.8 kg)   SpO2 98%   BMI 47.63 kg/m   Wt Readings from Last 3 Encounters:  02/14/18 286 lb 3.2 oz (129.8 kg)  07/13/17 281 lb 12.8 oz (127.8 kg)  03/31/17 282 lb 12.8 oz (128.3 kg)   BP Readings from Last 3 Encounters:  02/14/18 (!) 160/110  07/13/17 128/82  03/31/17 140/70   General appearance: alert, no distress, WD/WN, obese AA female Neck: supple, no lymphadenopathy, no thyromegaly, no masses Heart: RRR, normal S1, S2, no murmurs Lungs: CTA bilaterally, no wheezes, rhonchi, or rales Ext: no edema Pulses: 2+ symmetric, upper and lower extremities, normal cap refill Psych: pleasant, good eye contact, smiling at times, answers questions  appropriate   Assessment: Encounter Diagnoses  Name Primary?  . Irritability Yes  . Mood change   . Perimenopausal   . Essential hypertension   . Postsurgical hypothyroidism     Plan: We discussed her concerns and symptoms, likely related in part to menopause.  We discussed symptoms and signs of menopause versus depression or mood disorder.  She does not have clear symptoms of depression or mood disorder.  That she is at the age of likely menopause, last menstrual period almost 1 year ago.  We discussed stress reduction, good sleep hygiene, hydration, building and time for personal reflection and margin for rest.  Labs today as below  Consider Effexor or SSRI for symptoms, consider counseling  Advised she follow-up here with Dr. Redmond School in 1 month for her physical as planned and recheck on blood pressure  Jaydin was seen  today for mood swings.  Diagnoses and all orders for this visit:  Irritability -     TSH -     T4, free -     FSH/LH  Mood change -     TSH -     T4, free -     FSH/LH  Perimenopausal -     TSH -     T4, free -     FSH/LH  Essential hypertension -     TSH -     T4, free -     FSH/LH  Postsurgical hypothyroidism -     TSH -     T4, free -     FSH/LH

## 2018-02-14 NOTE — Patient Instructions (Signed)
Your symptoms may be related to menopausal symptoms  We will call with lab results  Review the information below.  Menopause Menopause is the normal time of life when menstrual periods stop completely. Menopause is complete when you have missed 12 consecutive menstrual periods. It usually occurs between the ages of 28 years and 76 years. Very rarely does a woman develop menopause before the age of 101 years. At menopause, your ovaries stop producing the female hormones estrogen and progesterone. This can cause undesirable symptoms and also affect your health. Sometimes the symptoms may occur 4-5 years before the menopause begins. There is no relationship between menopause and:  Oral contraceptives.  Number of children you had.  Race.  The age your menstrual periods started (menarche).  Heavy smokers and very thin women may develop menopause earlier in life. What are the causes?  The ovaries stop producing the female hormones estrogen and progesterone. Other causes include:  Surgery to remove both ovaries.  The ovaries stop functioning for no known reason.  Tumors of the pituitary gland in the brain.  Medical disease that affects the ovaries and hormone production.  Radiation treatment to the abdomen or pelvis.  Chemotherapy that affects the ovaries.  What are the signs or symptoms?  Hot flashes.  Night sweats.  Decrease in sex drive.  Vaginal dryness and thinning of the vagina causing painful intercourse.  Dryness of the skin and developing wrinkles.  Headaches.  Tiredness.  Irritability.  Memory problems.  Weight gain.  Bladder infections.  Hair growth of the face and chest.  Infertility. More serious symptoms include:  Loss of bone (osteoporosis) causing breaks (fractures).  Depression.  Hardening and narrowing of the arteries (atherosclerosis) causing heart attacks and strokes.  How is this diagnosed?  When the menstrual periods have stopped  for 12 straight months.  Physical exam.  Hormone studies of the blood. How is this treated? There are many treatment choices and nearly as many questions about them. The decisions to treat or not to treat menopausal changes is an individual choice made with your health care provider. Your health care provider can discuss the treatments with you. Together, you can decide which treatment will work best for you. Your treatment choices may include:  Hormone therapy (estrogen and progesterone).  Non-hormonal medicines.  Treating the individual symptoms with medicine (for example antidepressants for depression).  Herbal medicines that may help specific symptoms.  Counseling by a psychiatrist or psychologist.  Group therapy.  Lifestyle changes including: ? Eating healthy. ? Regular exercise. ? Limiting caffeine and alcohol. ? Stress management and meditation.  No treatment.  Follow these instructions at home:  Take the medicine your health care provider gives you as directed.  Get plenty of sleep and rest.  Exercise regularly.  Eat a diet that contains calcium (good for the bones) and soy products (acts like estrogen hormone).  Avoid alcoholic beverages.  Do not smoke.  If you have hot flashes, dress in layers.  Take supplements, calcium, and vitamin D to strengthen bones.  You can use over-the-counter lubricants or moisturizers for vaginal dryness.  Group therapy is sometimes very helpful.  Acupuncture may be helpful in some cases. Contact a health care provider if:  You are not sure you are in menopause.  You are having menopausal symptoms and need advice and treatment.  You are still having menstrual periods after age 70 years.  You have pain with intercourse.  Menopause is complete (no menstrual period for 12 months) and  you develop vaginal bleeding.  You need a referral to a specialist (gynecologist, psychiatrist, or psychologist) for treatment. Get help  right away if:  You have severe depression.  You have excessive vaginal bleeding.  You fell and think you have a broken bone.  You have pain when you urinate.  You develop leg or chest pain.  You have a fast pounding heart beat (palpitations).  You have severe headaches.  You develop vision problems.  You feel a lump in your breast.  You have abdominal pain or severe indigestion. This information is not intended to replace advice given to you by your health care provider. Make sure you discuss any questions you have with your health care provider. Document Released: 07/25/2003 Document Revised: 10/10/2015 Document Reviewed: 12/01/2012 Elsevier Interactive Patient Education  2017 [employer].

## 2018-02-15 ENCOUNTER — Other Ambulatory Visit: Payer: Self-pay | Admitting: Medical

## 2018-02-15 DIAGNOSIS — I10 Essential (primary) hypertension: Secondary | ICD-10-CM

## 2018-02-15 LAB — T4, FREE: FREE T4: 2.06 ng/dL — AB (ref 0.82–1.77)

## 2018-02-15 LAB — TSH: TSH: 0.298 u[IU]/mL — AB (ref 0.450–4.500)

## 2018-02-15 LAB — FSH/LH
FSH: 44.6 m[IU]/mL
LH: 32.9 m[IU]/mL

## 2018-02-15 MED ORDER — LEVOTHYROXINE SODIUM 150 MCG PO TABS: 150.0000 ug | ORAL_TABLET | Freq: Every day | ORAL | 1 refills | Status: DC

## 2018-02-23 ENCOUNTER — Other Ambulatory Visit: Payer: Self-pay | Admitting: Family Medicine

## 2018-03-21 ENCOUNTER — Encounter: Payer: Self-pay | Admitting: Family Medicine

## 2018-03-21 ENCOUNTER — Ambulatory Visit: Payer: 59 | Admitting: Family Medicine

## 2018-03-21 VITALS — BP 142/92 | HR 83 | Temp 98.2°F | Wt 289.6 lb

## 2018-03-21 DIAGNOSIS — I10 Essential (primary) hypertension: Secondary | ICD-10-CM | POA: Diagnosis not present

## 2018-03-21 DIAGNOSIS — R4586 Emotional lability: Secondary | ICD-10-CM

## 2018-03-21 NOTE — Progress Notes (Signed)
   Subjective:    Patient ID: LISMARY KIEHN, female    DOB: 18-Mar-1966, 52 y.o.   MRN: [id number]  HPI She is here for a recheck.  Since last being seen she has made some changes in her life and now recognizes the need to take better care of herself psychologically.  She is taking time out of her day to help deal with daily life stresses and has found this quite useful.  She does plan to continue this. We then discussed her weight.  She has made some dietary changes however has not gotten involved in a regular exercise program.  She did mention the possibility of that occurring with some of her coworkers.   Review of Systems     Objective:   Physical Exam Alert and in no distress.  Blood pressure and weight are recorded.       Assessment & Plan:  Essential hypertension  Mood change  Obesity, Class III, BMI 40-49.9 (morbid obesity) ([place]) Discussed her blood pressure in regard to her weight and exercise.  She will continue on her present blood pressure medication.  Encouraged her to get involved in a regular exercise program of 20 minutes daily or 150 minutes a week of something physical.  Also discussed dietary modification, mainly by cutting back on carbohydrates.

## 2018-03-21 NOTE — Patient Instructions (Addendum)
Emotions are neither right nor wrong, they just are. 20 minutes of something physical daily or 150 minutes a week. Set a goal pants or dress size

## 2018-05-04 ENCOUNTER — Ambulatory Visit: Payer: 59 | Admitting: Family Medicine

## 2018-05-04 ENCOUNTER — Encounter: Payer: Self-pay | Admitting: Family Medicine

## 2018-05-04 VITALS — BP 138/90 | HR 76 | Temp 97.9°F | Ht 65.0 in | Wt 289.0 lb

## 2018-05-04 DIAGNOSIS — Z23 Encounter for immunization: Secondary | ICD-10-CM

## 2018-05-04 DIAGNOSIS — I10 Essential (primary) hypertension: Secondary | ICD-10-CM | POA: Diagnosis not present

## 2018-05-04 DIAGNOSIS — E559 Vitamin D deficiency, unspecified: Secondary | ICD-10-CM

## 2018-05-04 DIAGNOSIS — Z0000 Encounter for general adult medical examination without abnormal findings: Secondary | ICD-10-CM | POA: Diagnosis not present

## 2018-05-04 DIAGNOSIS — Z1239 Encounter for other screening for malignant neoplasm of breast: Secondary | ICD-10-CM

## 2018-05-04 DIAGNOSIS — Z85850 Personal history of malignant neoplasm of thyroid: Secondary | ICD-10-CM | POA: Diagnosis not present

## 2018-05-04 DIAGNOSIS — E890 Postprocedural hypothyroidism: Secondary | ICD-10-CM

## 2018-05-04 LAB — POCT URINALYSIS DIP (PROADVANTAGE DEVICE)
BILIRUBIN UA: NEGATIVE
Glucose, UA: NEGATIVE mg/dL
Ketones, POC UA: NEGATIVE mg/dL
LEUKOCYTES UA: NEGATIVE
Nitrite, UA: NEGATIVE
Protein Ur, POC: NEGATIVE mg/dL
Specific Gravity, Urine: 1.025
Urobilinogen, Ur: 3.5
pH, UA: 6 (ref 5.0–8.0)

## 2018-05-04 NOTE — Patient Instructions (Signed)
Check out the DASH diet 

## 2018-05-04 NOTE — Progress Notes (Signed)
   Subjective:    Patient ID: Katie Aguirre, female    DOB: [birth year], 52 y.o.   MRN: [id number]  HPI She is here for complete examination.  She plans to have a Pap done by her gynecologist.  She has started a weight loss and exercise program and has been doing this for roughly 1 month.  Also she had her thyroid medication readjusted recently.  She does have an underlying history of PCOS has not had any difficulty with that.  She also has a previous history of vitamin D deficiency.  Recent blood work was done on that as well.  Family and social history as well as health maintenance and immunizations was reviewed   Review of Systems  All other systems reviewed and are negative.      Objective:   Physical Exam BP 138/90 (BP Location: Left Arm, Patient Position: Sitting)   Pulse 76   Temp 97.9 F (36.6 C)   Ht 5\' 5"  (1.651 m)   Wt 289 lb (131.1 kg)   LMP 04/04/2017   SpO2 94%   BMI 48.09 kg/m   General Appearance:    Alert, cooperative, no distress, appears stated age  Head:    Normocephalic, without obvious abnormality, atraumatic  Eyes:    PERRL, conjunctiva/corneas clear, EOM's intact, fundi    benign  Ears:    Normal TM's and external ear canals  Nose:   Nares normal, mucosa normal, no drainage or sinus   tenderness  Throat:   Lips, mucosa, and tongue normal; teeth and gums normal  Neck:   Supple, no lymphadenopathy;  thyroid:  no   enlargement/tenderness/nodules; no carotid   bruit or JVD     Lungs:     Clear to auscultation bilaterally without wheezes, rales or     ronchi; respirations unlabored      Heart:    Regular rate and rhythm, S1 and S2 normal, no murmur, rub   or gallop  Breast Exam:    Deferred to GYN  Abdomen:     Soft, non-tender, nondistended, normoactive bowel sounds,    no masses, no hepatosplenomegaly  Genitalia:    Deferred to GYN     Extremities:   No clubbing, cyanosis or edema  Pulses:   2+ and symmetric all extremities  Skin:   Skin color,  texture, turgor normal, no rashes or lesions  Lymph nodes:   Cervical, supraclavicular, and axillary nodes normal  Neurologic:   CNII-XII intact, normal strength, sensation and gait; reflexes 2+ and symmetric throughout          Psych:   Normal mood, affect, hygiene and grooming.        Assessment & Plan:  Essential hypertension - Plan: CBC with Differential/Platelet, Comprehensive metabolic panel  Obesity, Class III, BMI 40-49.9 (morbid obesity) (HCC)  Postsurgical hypothyroidism - Plan: TSH  Vitamin D deficiency  History of thyroid cancer - Plan: TSH  Routine general medical examination at a health care facility - Plan: CBC with Differential/Platelet, Comprehensive metabolic panel, Lipid panel  Need for Tdap vaccination - Plan: Tdap vaccine greater than or equal to 7yo IM  Screening for breast cancer - Plan: MM DIGITAL SCREENING BILATERAL I encouraged her to continue with her present lifestyle.  I will recheck her in 3 months and see how her weight and blood pressure are doing.

## 2018-05-05 LAB — COMPREHENSIVE METABOLIC PANEL
ALT: 18 IU/L (ref 0–32)
AST: 14 IU/L (ref 0–40)
Albumin/Globulin Ratio: 1.8 (ref 1.2–2.2)
Albumin: 4.8 g/dL (ref 3.5–5.5)
Alkaline Phosphatase: 91 IU/L (ref 39–117)
BUN / CREAT RATIO: 12 (ref 9–23)
BUN: 13 mg/dL (ref 6–24)
Bilirubin Total: 0.9 mg/dL (ref 0.0–1.2)
CO2: 25 mmol/L (ref 20–29)
Calcium: 10 mg/dL (ref 8.7–10.2)
Chloride: 103 mmol/L (ref 96–106)
Creatinine, Ser: 1.08 mg/dL — ABNORMAL HIGH (ref 0.57–1.00)
GFR calc Af Amer: 68 mL/min/{1.73_m2} (ref >59)
GFR calc non Af Amer: 59 mL/min/{1.73_m2} — ABNORMAL LOW (ref >59)
Globulin, Total: 2.7 g/dL (ref 1.5–4.5)
Glucose: 91 mg/dL (ref 65–99)
POTASSIUM: 4.1 mmol/L (ref 3.5–5.2)
Sodium: 142 mmol/L (ref 134–144)
Total Protein: 7.5 g/dL (ref 6.0–8.5)

## 2018-05-05 LAB — CBC WITH DIFFERENTIAL/PLATELET
BASOS: 0 %
Basophils Absolute: 0 10*3/uL (ref 0.0–0.2)
EOS (ABSOLUTE): 0.2 10*3/uL (ref 0.0–0.4)
Eos: 2 %
Hematocrit: 39.6 % (ref 34.0–46.6)
Hemoglobin: 13.1 g/dL (ref 11.1–15.9)
Immature Grans (Abs): 0 10*3/uL (ref 0.0–0.1)
Immature Granulocytes: 0 %
Lymphocytes Absolute: 2.5 10*3/uL (ref 0.7–3.1)
Lymphs: 36 %
MCH: 26.8 pg (ref 26.6–33.0)
MCHC: 33.1 g/dL (ref 31.5–35.7)
MCV: 81 fL (ref 79–97)
Monocytes Absolute: 0.5 10*3/uL (ref 0.1–0.9)
Monocytes: 7 %
Neutrophils Absolute: 3.9 10*3/uL (ref 1.4–7.0)
Neutrophils: 55 %
Platelets: 319 10*3/uL (ref 150–450)
RBC: 4.89 x10E6/uL (ref 3.77–5.28)
RDW: 14.4 % (ref 12.3–15.4)
WBC: 7 10*3/uL (ref 3.4–10.8)

## 2018-05-05 LAB — TSH: TSH: 2.16 u[IU]/mL (ref 0.450–4.500)

## 2018-05-05 LAB — LIPID PANEL
Chol/HDL Ratio: 3.4 ratio (ref 0.0–4.4)
Cholesterol, Total: 179 mg/dL (ref 100–199)
HDL: 52 mg/dL (ref >39)
LDL Calculated: 114 mg/dL — ABNORMAL HIGH (ref 0–99)
Triglycerides: 63 mg/dL (ref 0–149)
VLDL Cholesterol Cal: 13 mg/dL (ref 5–40)

## 2018-05-08 ENCOUNTER — Other Ambulatory Visit: Payer: Self-pay | Admitting: Medical

## 2018-05-13 ENCOUNTER — Telehealth: Payer: Self-pay

## 2018-05-13 NOTE — Telephone Encounter (Signed)
I mentioned using the DASH diet for her

## 2018-05-13 NOTE — Telephone Encounter (Signed)
Pt wanted to know weght loss web address you all talked about at her last appt. Please advise. [place]

## 2018-05-16 NOTE — Telephone Encounter (Signed)
lvm for pt to start Dash diet. [place]

## 2018-06-08 ENCOUNTER — Other Ambulatory Visit: Payer: Self-pay

## 2018-06-08 DIAGNOSIS — I10 Essential (primary) hypertension: Secondary | ICD-10-CM

## 2018-06-08 NOTE — Telephone Encounter (Signed)
[facility] sent fax stating that Losartan Hydrochlorothiazide is on back order. Need substitute.

## 2018-07-11 ENCOUNTER — Other Ambulatory Visit: Payer: Self-pay | Admitting: Family Medicine

## 2018-07-21 ENCOUNTER — Other Ambulatory Visit: Payer: Self-pay | Admitting: Family Medicine

## 2018-07-21 DIAGNOSIS — Z1231 Encounter for screening mammogram for malignant neoplasm of breast: Secondary | ICD-10-CM

## 2018-08-10 ENCOUNTER — Ambulatory Visit: Payer: 59 | Admitting: Family Medicine

## 2018-08-19 ENCOUNTER — Ambulatory Visit: Payer: 59

## 2018-08-24 ENCOUNTER — Encounter: Payer: Self-pay | Admitting: Family Medicine

## 2018-08-24 ENCOUNTER — Ambulatory Visit (INDEPENDENT_AMBULATORY_CARE_PROVIDER_SITE_OTHER): Payer: 59 | Admitting: Family Medicine

## 2018-08-24 ENCOUNTER — Other Ambulatory Visit: Payer: Self-pay

## 2018-08-24 VITALS — BP 138/72 | Wt 280.0 lb

## 2018-08-24 DIAGNOSIS — I10 Essential (primary) hypertension: Secondary | ICD-10-CM | POA: Diagnosis not present

## 2018-08-24 NOTE — Progress Notes (Signed)
   Subjective:    Patient ID: Katie Aguirre, female    DOB: [birth year], 53 y.o.   MRN: [id number]  HPI Documentation for virtual telephone encounter.  Documentation for virtual audio and video telecommunications through Zoom encounter: The patient was located at home. The provider was located in the office. The patient did consent to this visit and is aware of possible charges through their insurance for this visit. The other persons participating in this telemedicine service were none. This virtual service is not related to other E/M service within previous 7 days. She is keeping track of her weight and states that she is lost at least 5 pounds within the last several weeks.  She did check her blood pressure and it is recorded.  She is walking for 30 to 45 minutes roughly 4 days/week.  She has cut back on her carbohydrates.  She does feel that she has made some successful changes.   Review of Systems     Objective:   Physical Exam Alert and in no distress otherwise not examined       Assessment & Plan:  Essential hypertension  Obesity, Class III, BMI 40-49.9 (morbid obesity) ([place]) I encouraged her to continue with her diet and exercise program.  Recommend she set up an appointment to see me in roughly 3 months and bring her blood pressure cuff in with her to measure against ours.  She was comfortable with that.

## 2018-09-06 ENCOUNTER — Other Ambulatory Visit: Payer: Self-pay | Admitting: Family Medicine

## 2018-10-14 ENCOUNTER — Ambulatory Visit
Admission: RE | Admit: 2018-10-14 | Discharge: 2018-10-14 | Disposition: A | Discharge status: Home / Self Care | Payer: 59 | Source: Ambulatory Visit | Attending: Family Medicine | Admitting: Family Medicine

## 2018-10-14 ENCOUNTER — Other Ambulatory Visit: Payer: Self-pay

## 2018-10-14 DIAGNOSIS — Z1231 Encounter for screening mammogram for malignant neoplasm of breast: Secondary | ICD-10-CM

## 2018-10-23 ENCOUNTER — Other Ambulatory Visit: Payer: Self-pay | Admitting: Family Medicine

## 2019-01-07 ENCOUNTER — Other Ambulatory Visit: Payer: Self-pay | Admitting: Family Medicine

## 2019-02-17 ENCOUNTER — Other Ambulatory Visit: Payer: Self-pay | Admitting: Family Medicine

## 2019-02-17 DIAGNOSIS — I10 Essential (primary) hypertension: Secondary | ICD-10-CM

## 2019-03-08 ENCOUNTER — Other Ambulatory Visit: Payer: Self-pay | Admitting: Family Medicine

## 2019-03-15 ENCOUNTER — Telehealth: Payer: Self-pay | Admitting: Family Medicine

## 2019-03-15 NOTE — Telephone Encounter (Signed)
Pt called for refills of flonase. She states she last had it filled in 2019. Chart reflects 2015. Please send to [facility] and [place].

## 2019-03-16 MED ORDER — FLUTICASONE PROPIONATE 50 MCG/ACT NA SUSP: NASAL | 5 refills | Status: DC

## 2019-04-05 ENCOUNTER — Telehealth: Payer: Self-pay

## 2019-04-05 NOTE — Telephone Encounter (Signed)
Called pt to find out if she has a OBGYN and when was her last Pap. No answer and lvm. [place]

## 2019-04-06 ENCOUNTER — Telehealth: Payer: Self-pay | Admitting: Family Medicine

## 2019-04-06 NOTE — Telephone Encounter (Signed)
Pt called back in response to a call concerning pap. She states she has not had one in a long time and would like to set something up with [facility]. She states she would like [place] to take care of that.

## 2019-04-06 NOTE — Telephone Encounter (Signed)
Pt was advised to contact their office to schedule due to referral not needed. [place]

## 2019-05-08 ENCOUNTER — Telehealth: Payer: Self-pay

## 2019-05-08 ENCOUNTER — Other Ambulatory Visit: Payer: Self-pay | Admitting: Family Medicine

## 2019-05-08 NOTE — Telephone Encounter (Signed)
Called pt to advise of med check appt needed. No answer lvm and sent med in for thirty days [facility]

## 2019-06-10 ENCOUNTER — Other Ambulatory Visit: Payer: Self-pay | Admitting: Family Medicine

## 2019-06-14 ENCOUNTER — Ambulatory Visit: Payer: BC Managed Care – PPO | Admitting: Family Medicine

## 2019-06-14 ENCOUNTER — Encounter: Payer: Self-pay | Admitting: Family Medicine

## 2019-06-14 ENCOUNTER — Other Ambulatory Visit: Payer: Self-pay

## 2019-06-14 VITALS — BP 130/88 | HR 79 | Temp 98.0°F | Wt 290.8 lb

## 2019-06-14 DIAGNOSIS — E559 Vitamin D deficiency, unspecified: Secondary | ICD-10-CM | POA: Diagnosis not present

## 2019-06-14 DIAGNOSIS — D509 Iron deficiency anemia, unspecified: Secondary | ICD-10-CM | POA: Diagnosis not present

## 2019-06-14 DIAGNOSIS — F329 Major depressive disorder, single episode, unspecified: Secondary | ICD-10-CM

## 2019-06-14 DIAGNOSIS — I10 Essential (primary) hypertension: Secondary | ICD-10-CM

## 2019-06-14 DIAGNOSIS — N951 Menopausal and female climacteric states: Secondary | ICD-10-CM | POA: Diagnosis not present

## 2019-06-14 DIAGNOSIS — E890 Postprocedural hypothyroidism: Secondary | ICD-10-CM

## 2019-06-14 DIAGNOSIS — J309 Allergic rhinitis, unspecified: Secondary | ICD-10-CM

## 2019-06-14 MED ORDER — CITALOPRAM HYDROBROMIDE 20 MG PO TABS: 20.0000 mg | ORAL_TABLET | Freq: Every day | ORAL | 3 refills | Status: DC

## 2019-06-14 MED ORDER — LOSARTAN POTASSIUM-HCTZ 50-12.5 MG PO TABS: 1.0000 | ORAL_TABLET | Freq: Every day | ORAL | 3 refills | Status: DC

## 2019-06-14 MED ORDER — LEVOTHYROXINE SODIUM 150 MCG PO TABS: ORAL_TABLET | ORAL | 3 refills | Status: DC

## 2019-06-14 MED ORDER — AMLODIPINE BESYLATE 10 MG PO TABS: ORAL_TABLET | ORAL | 3 refills | Status: DC

## 2019-06-14 NOTE — Progress Notes (Signed)
   Subjective:    Patient ID: Katie Aguirre, female    DOB: 13-Jul-1965, 54 y.o.   MRN: [id number]  HPI She is here for an interval evaluation.  She is under a great deal of stress dealing with brain cancer that her husband has.  He is being followed by radiation oncology.  She notes mood swings, easily crying, being under a lot of stress and stress eating.  She is also perimenopausal and continues to have menses every 3 months.  She does plan to get a Pap smear in the near future.  She does have a history of vitamin D deficiency.  She continues on thyroid medication following surgery for thyroid cancer.  She continues on losartan/HCTZ.  She does use Flonase for her allergies.   Review of Systems     Objective:   Physical Exam Alert and in no distress. Tympanic membranes and canals are normal. Pharyngeal area is normal. Neck is supple without adenopathy or thyromegaly. Cardiac exam shows a regular sinus rhythm without murmurs or gallops. Lungs are clear to auscultation.        Assessment & Plan:  Reactive depression - Plan: citalopram (CELEXA) 20 MG tablet  Postsurgical hypothyroidism - Plan: TSH, levothyroxine (SYNTHROID) 150 MCG tablet  Vitamin D deficiency - Plan: VITAMIN D 25 Hydroxy (Vit-D Deficiency, Fractures)  Perimenopausal  Obesity, Class III, BMI 40-49.9 (morbid obesity) ([place]) - Plan: CBC with Differential/Platelet, Comprehensive metabolic panel, Lipid panel  Iron deficiency anemia, unspecified iron deficiency anemia type - Plan: CBC with Differential/Platelet  Essential hypertension - Plan: amLODipine (NORVASC) 10 MG tablet, losartan-hydrochlorothiazide (HYZAAR) 50-12.5 MG tablet I discussed the stress that she is dealing with in regard to her husband.  Encouraged her to get involved in counseling for this.  We will also place her on Celexa and recheck that in 1 month.  She will also come in for a Pap at that time.  Her medications were otherwise reviewed and  renewed.  Briefly discussed her weight but under the circumstances did not do well on that.

## 2019-06-15 LAB — COMPREHENSIVE METABOLIC PANEL
ALT: 20 IU/L (ref 0–32)
AST: 17 IU/L (ref 0–40)
Albumin/Globulin Ratio: 1.6 (ref 1.2–2.2)
Albumin: 4.2 g/dL (ref 3.8–4.9)
Alkaline Phosphatase: 98 IU/L (ref 39–117)
BUN/Creatinine Ratio: 12 (ref 9–23)
BUN: 13 mg/dL (ref 6–24)
Bilirubin Total: 0.7 mg/dL (ref 0.0–1.2)
CO2: 25 mmol/L (ref 20–29)
Calcium: 9.5 mg/dL (ref 8.7–10.2)
Chloride: 107 mmol/L — ABNORMAL HIGH (ref 96–106)
Creatinine, Ser: 1.07 mg/dL — ABNORMAL HIGH (ref 0.57–1.00)
GFR calc Af Amer: 68 mL/min/{1.73_m2} (ref >59)
GFR calc non Af Amer: 59 mL/min/{1.73_m2} — ABNORMAL LOW (ref >59)
Globulin, Total: 2.6 g/dL (ref 1.5–4.5)
Glucose: 98 mg/dL (ref 65–99)
Potassium: 4.4 mmol/L (ref 3.5–5.2)
Sodium: 144 mmol/L (ref 134–144)
Total Protein: 6.8 g/dL (ref 6.0–8.5)

## 2019-06-15 LAB — LIPID PANEL
Chol/HDL Ratio: 3.4 ratio (ref 0.0–4.4)
Cholesterol, Total: 173 mg/dL (ref 100–199)
HDL: 51 mg/dL (ref >39)
LDL Chol Calc (NIH): 111 mg/dL — ABNORMAL HIGH (ref 0–99)
Triglycerides: 56 mg/dL (ref 0–149)
VLDL Cholesterol Cal: 11 mg/dL (ref 5–40)

## 2019-06-15 LAB — CBC WITH DIFFERENTIAL/PLATELET
Basophils Absolute: 0 10*3/uL (ref 0.0–0.2)
Basos: 0 %
EOS (ABSOLUTE): 0.1 10*3/uL (ref 0.0–0.4)
Eos: 2 %
Hematocrit: 39.2 % (ref 34.0–46.6)
Hemoglobin: 12.4 g/dL (ref 11.1–15.9)
Immature Grans (Abs): 0 10*3/uL (ref 0.0–0.1)
Immature Granulocytes: 0 %
Lymphocytes Absolute: 2.3 10*3/uL (ref 0.7–3.1)
Lymphs: 35 %
MCH: 25.9 pg — ABNORMAL LOW (ref 26.6–33.0)
MCHC: 31.6 g/dL (ref 31.5–35.7)
MCV: 82 fL (ref 79–97)
Monocytes Absolute: 0.4 10*3/uL (ref 0.1–0.9)
Monocytes: 7 %
Neutrophils Absolute: 3.7 10*3/uL (ref 1.4–7.0)
Neutrophils: 56 %
Platelets: 300 10*3/uL (ref 150–450)
RBC: 4.78 x10E6/uL (ref 3.77–5.28)
RDW: 14.4 % (ref 11.7–15.4)
WBC: 6.6 10*3/uL (ref 3.4–10.8)

## 2019-06-15 LAB — TSH: TSH: 2.99 u[IU]/mL (ref 0.450–4.500)

## 2019-06-15 LAB — VITAMIN D 25 HYDROXY (VIT D DEFICIENCY, FRACTURES): Vit D, 25-Hydroxy: 25.9 ng/mL — ABNORMAL LOW (ref 30.0–100.0)

## 2019-07-17 ENCOUNTER — Ambulatory Visit: Payer: BC Managed Care – PPO | Admitting: Family Medicine

## 2019-07-20 ENCOUNTER — Encounter: Payer: Self-pay | Admitting: Family Medicine

## 2019-07-20 ENCOUNTER — Other Ambulatory Visit: Payer: Self-pay

## 2019-07-20 ENCOUNTER — Other Ambulatory Visit (HOSPITAL_COMMUNITY)
Admission: RE | Admit: 2019-07-20 | Discharge: 2019-07-20 | Disposition: A | Discharge status: Home / Self Care | Payer: BC Managed Care – PPO | Source: Ambulatory Visit | Attending: Family Medicine | Admitting: Family Medicine

## 2019-07-20 ENCOUNTER — Ambulatory Visit: Payer: BC Managed Care – PPO | Admitting: Family Medicine

## 2019-07-20 VITALS — BP 146/88 | HR 87 | Temp 98.0°F | Wt 293.4 lb

## 2019-07-20 DIAGNOSIS — N951 Menopausal and female climacteric states: Secondary | ICD-10-CM

## 2019-07-20 DIAGNOSIS — L723 Sebaceous cyst: Secondary | ICD-10-CM

## 2019-07-20 DIAGNOSIS — J309 Allergic rhinitis, unspecified: Secondary | ICD-10-CM

## 2019-07-20 DIAGNOSIS — E890 Postprocedural hypothyroidism: Secondary | ICD-10-CM | POA: Diagnosis not present

## 2019-07-20 DIAGNOSIS — Z124 Encounter for screening for malignant neoplasm of cervix: Secondary | ICD-10-CM | POA: Diagnosis not present

## 2019-07-20 DIAGNOSIS — Z0000 Encounter for general adult medical examination without abnormal findings: Secondary | ICD-10-CM

## 2019-07-20 DIAGNOSIS — Z85850 Personal history of malignant neoplasm of thyroid: Secondary | ICD-10-CM

## 2019-07-20 DIAGNOSIS — E282 Polycystic ovarian syndrome: Secondary | ICD-10-CM

## 2019-07-20 DIAGNOSIS — I10 Essential (primary) hypertension: Secondary | ICD-10-CM

## 2019-07-20 DIAGNOSIS — E559 Vitamin D deficiency, unspecified: Secondary | ICD-10-CM

## 2019-07-20 DIAGNOSIS — F329 Major depressive disorder, single episode, unspecified: Secondary | ICD-10-CM

## 2019-07-20 NOTE — Progress Notes (Signed)
   Subjective:    Patient ID: Katie Aguirre, female    DOB: February 28, 1966, 54 y.o.   MRN: [id number]  HPI She is here for complete examination.  She has been taking her Celexa intermittently and but does state that she is 75% better.  Apparently her husband is doing well with his CNS cancer.  She does have a history of vitamin D deficiency and does take 2000 IU of vitamin D as well as a multivitamin.  She does plan on starting an exercise program.  She continues on thyroid medication for treatment of hypothyroid secondary to surgery for thyroid cancer.  Her allergies seem to be under good control.  She does have a history of PCO S and over the last year she has had at least 3 cycles.  Most recent one was February.  She continues on iron supplementation for her anemia.  She takes amlodipine and losartan/HCTZ for her blood pressure.  Work and home life are going well.  Family and social history as well as health maintenance and immunizations was reviewed.  She has had both of her Covid shots.   Review of Systems  All other systems reviewed and are negative.      Objective:   Physical Exam Alert and in no distress. Tympanic membranes and canals are normal. Pharyngeal area is normal. Neck is supple without adenopathy or thyromegaly. Cardiac exam shows a regular sinus rhythm without murmurs or gallops. Lungs are clear to auscultation.  Abdominal exam shows no masses or tenderness.  Pelvic exam shows a normal-appearing cervix.  No adnexal masses palpated.  Pap smear taken.       Assessment & Plan:  Routine general medical examination at a health care facility  Reactive depression  Postsurgical hypothyroidism  Vitamin D deficiency  Obesity, Class III, BMI 40-49.9 (morbid obesity) (HCC)  Perimenopausal  PCOS (polycystic ovarian syndrome)  Postoperative hypothyroidism  Essential hypertension  Allergic rhinitis, unspecified seasonality, unspecified trigger  History of thyroid  cancer  Sebaceous cyst  Screening for cervical cancer - Plan: Cytology - PAP([place])  I will refer her to [facility] to be considered for a weight loss program and will therefore hold off on treating her blood pressure.  She will continue on her present medications but do want her to increase her vitamin D since she was slightly low. I then discussed the Celexa with her.  Encouraged her to take the medication on a regular basis explaining that this is a control issue and is not to be used on an as-needed basis for when she is having psychological difficulty.  Want to recheck that in approximately 2 months.  She was comfortable with that.

## 2019-07-21 DIAGNOSIS — R87610 Atypical squamous cells of undetermined significance on cytologic smear of cervix (ASC-US): Secondary | ICD-10-CM | POA: Diagnosis not present

## 2019-07-26 LAB — CYTOLOGY - PAP
Comment: NEGATIVE
Diagnosis: UNDETERMINED — AB
High risk HPV: NEGATIVE

## 2019-09-04 ENCOUNTER — Other Ambulatory Visit: Payer: Self-pay | Admitting: Family Medicine

## 2019-09-04 DIAGNOSIS — Z1231 Encounter for screening mammogram for malignant neoplasm of breast: Secondary | ICD-10-CM

## 2019-09-19 ENCOUNTER — Ambulatory Visit: Payer: BC Managed Care – PPO | Admitting: Family Medicine

## 2019-09-19 ENCOUNTER — Encounter: Payer: Self-pay | Admitting: Family Medicine

## 2019-09-19 VITALS — BP 132/82 | HR 82 | Temp 97.6°F | Wt 286.2 lb

## 2019-09-19 DIAGNOSIS — L723 Sebaceous cyst: Secondary | ICD-10-CM

## 2019-09-19 DIAGNOSIS — L089 Local infection of the skin and subcutaneous tissue, unspecified: Secondary | ICD-10-CM | POA: Diagnosis not present

## 2019-09-19 NOTE — Progress Notes (Signed)
   Subjective:    Patient ID: Katie Aguirre, female    DOB: August 27, 1965, 54 y.o.   MRN: [id number]  HPI She has had difficulty with a cyst to became infected on her back.  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  Review of Systems     Objective:   Physical Exam Exam of her mid back area does show a 1 cm incision that was made with some drainage from it.  There is surrounding induration of approximately 4 cm.       Assessment & Plan:  Infected sebaceous cyst The wound was injected with Xylocaine and epinephrine and expanded to approximately 3 cm.  Purulent material was expressed as well as the cyst sac material.  The wound was cleaned with gauze and the cyst had to be dug out.  It was packed with a large amount of iodoform gauze.  She will return here in 2 days for removal.

## 2019-09-21 ENCOUNTER — Other Ambulatory Visit: Payer: Self-pay

## 2019-09-21 ENCOUNTER — Encounter: Payer: Self-pay | Admitting: Family Medicine

## 2019-09-21 ENCOUNTER — Ambulatory Visit: Payer: BC Managed Care – PPO | Admitting: Family Medicine

## 2019-09-21 VITALS — BP 132/80 | HR 80 | Temp 98.2°F | Wt 289.6 lb

## 2019-09-21 DIAGNOSIS — L089 Local infection of the skin and subcutaneous tissue, unspecified: Secondary | ICD-10-CM

## 2019-09-21 DIAGNOSIS — L723 Sebaceous cyst: Secondary | ICD-10-CM

## 2019-09-21 NOTE — Progress Notes (Signed)
   Subjective:    Patient ID: Katie Aguirre, female    DOB: 01-Nov-1965, 54 y.o.   MRN: [id number]  HPI She is here for packing removal.  She did have some drainage but was able to take care of this on her own.   Review of Systems     Objective:   Physical Exam Exam of her back does show no surrounding erythema warmth or tenderness.       Assessment & Plan:  Infected sebaceous cyst The packing was removed without difficulty.  She is to use triple antibiotic ointment on that and irrigate this as often as possible so can heal from the inside out.  She was comfortable with that.

## 2019-12-04 ENCOUNTER — Other Ambulatory Visit: Payer: Self-pay

## 2019-12-04 ENCOUNTER — Ambulatory Visit
Admission: RE | Admit: 2019-12-04 | Discharge: 2019-12-04 | Disposition: A | Discharge status: Home / Self Care | Payer: BC Managed Care – PPO | Source: Ambulatory Visit

## 2019-12-04 DIAGNOSIS — Z780 Asymptomatic menopausal state: Secondary | ICD-10-CM | POA: Diagnosis not present

## 2019-12-04 DIAGNOSIS — Z1231 Encounter for screening mammogram for malignant neoplasm of breast: Secondary | ICD-10-CM

## 2019-12-04 DIAGNOSIS — R350 Frequency of micturition: Secondary | ICD-10-CM | POA: Diagnosis not present

## 2019-12-04 DIAGNOSIS — Z124 Encounter for screening for malignant neoplasm of cervix: Secondary | ICD-10-CM | POA: Diagnosis not present

## 2019-12-04 DIAGNOSIS — Z01419 Encounter for gynecological examination (general) (routine) without abnormal findings: Secondary | ICD-10-CM | POA: Diagnosis not present

## 2020-02-27 DIAGNOSIS — R0981 Nasal congestion: Secondary | ICD-10-CM | POA: Diagnosis not present

## 2020-02-27 DIAGNOSIS — R051 Acute cough: Secondary | ICD-10-CM | POA: Diagnosis not present

## 2020-02-27 DIAGNOSIS — R059 Cough, unspecified: Secondary | ICD-10-CM | POA: Diagnosis not present

## 2020-03-21 ENCOUNTER — Telehealth: Payer: Self-pay

## 2020-03-21 NOTE — Telephone Encounter (Signed)
Called pt to advise of the need for CPE. [place]

## 2020-04-18 ENCOUNTER — Ambulatory Visit (INDEPENDENT_AMBULATORY_CARE_PROVIDER_SITE_OTHER): Payer: BC Managed Care – PPO | Admitting: Family Medicine

## 2020-04-18 ENCOUNTER — Encounter: Payer: Self-pay | Admitting: Family Medicine

## 2020-04-18 ENCOUNTER — Other Ambulatory Visit: Payer: Self-pay

## 2020-04-18 VITALS — BP 142/86 | HR 82 | Temp 97.1°F | Ht 65.5 in | Wt 287.6 lb

## 2020-04-18 DIAGNOSIS — I10 Essential (primary) hypertension: Secondary | ICD-10-CM | POA: Diagnosis not present

## 2020-04-18 DIAGNOSIS — Z0000 Encounter for general adult medical examination without abnormal findings: Secondary | ICD-10-CM

## 2020-04-18 DIAGNOSIS — D509 Iron deficiency anemia, unspecified: Secondary | ICD-10-CM

## 2020-04-18 DIAGNOSIS — Z1159 Encounter for screening for other viral diseases: Secondary | ICD-10-CM

## 2020-04-18 DIAGNOSIS — J309 Allergic rhinitis, unspecified: Secondary | ICD-10-CM

## 2020-04-18 DIAGNOSIS — E890 Postprocedural hypothyroidism: Secondary | ICD-10-CM | POA: Diagnosis not present

## 2020-04-18 DIAGNOSIS — N951 Menopausal and female climacteric states: Secondary | ICD-10-CM

## 2020-04-18 DIAGNOSIS — E559 Vitamin D deficiency, unspecified: Secondary | ICD-10-CM | POA: Diagnosis not present

## 2020-04-18 DIAGNOSIS — Z23 Encounter for immunization: Secondary | ICD-10-CM

## 2020-04-18 DIAGNOSIS — F329 Major depressive disorder, single episode, unspecified: Secondary | ICD-10-CM

## 2020-04-18 DIAGNOSIS — Z7189 Other specified counseling: Secondary | ICD-10-CM | POA: Diagnosis not present

## 2020-04-18 MED ORDER — LOSARTAN POTASSIUM-HCTZ 100-12.5 MG PO TABS: 1.0000 | ORAL_TABLET | Freq: Every day | ORAL | 3 refills | Status: DC

## 2020-04-18 NOTE — Progress Notes (Signed)
   Subjective:    Patient ID: Katie Aguirre, female    DOB: Aug 20, 1965, 54 y.o.   MRN: [id number]  HPI She is here for complete examination.  She did eat roughly 4 hours ago.  Her husband died several months ago.  She is involved in bereavement counseling and is noting seeing some benefit from that.  She has a 49 and a 54 year old that are all involved in hospice bereavement counseling.  She also recently had an abnormal Pap at follow-up Pap at the gynecologist was negative.  Gynecologist also switched from Celexa to venlafaxine to see if that would help with her hot flashes.  She continues on her thyroid medication.  She is also taking losartan/HCTZ and amlodipine.  Her allergies seem to be under good control.  She does have a previous history of vitamin D deficiency.  She also has a previous history of anemia.  Her work seems to be going well.  Family and social history as well as health maintenance and immunizations was reviewed.   Review of Systems  All other systems reviewed and are negative.      Objective:   Physical Exam Alert and in no distress. Tympanic membranes and canals are normal. Pharyngeal area is normal. Neck is supple without adenopathy or thyromegaly. Cardiac exam shows a regular sinus rhythm without murmurs or gallops. Lungs are clear to auscultation.        Assessment & Plan:  Routine general medical examination at a health care facility - Plan: CBC with Differential/Platelet, Comprehensive metabolic panel, Lipid panel  Essential hypertension - Plan: losartan-hydrochlorothiazide (HYZAAR) 100-12.5 MG tablet  Need for influenza vaccination - Plan: Flu Vaccine QUAD 6+ mos PF IM (Fluarix Quad PF)  Bereavement counseling  Obesity, Class III, BMI 40-49.9 (morbid obesity) ([place])  Reactive depression  Need for hepatitis C screening test - Plan: Hepatitis C antibody  Vitamin D deficiency - Plan: VITAMIN D 25 Hydroxy (Vit-D Deficiency,  Fractures)  Postoperative hypothyroidism - Plan: TSH  Allergic rhinitis, unspecified seasonality, unspecified trigger  Perimenopausal  Iron deficiency anemia, unspecified iron deficiency anemia type  I encouraged her to continue in bereavement counseling.  She will follow up with gynecologist concerning venlafaxine and her postmenopausal symptoms.  Continue on present medications.  No therapy at the present time for her allergies.

## 2020-04-19 LAB — COMPREHENSIVE METABOLIC PANEL
ALT: 20 IU/L (ref 0–32)
AST: 17 IU/L (ref 0–40)
Albumin/Globulin Ratio: 1.6 (ref 1.2–2.2)
Albumin: 4.3 g/dL (ref 3.8–4.9)
Alkaline Phosphatase: 98 IU/L (ref 44–121)
BUN/Creatinine Ratio: 13 (ref 9–23)
BUN: 14 mg/dL (ref 6–24)
Bilirubin Total: 0.7 mg/dL (ref 0.0–1.2)
CO2: 23 mmol/L (ref 20–29)
Calcium: 9.6 mg/dL (ref 8.7–10.2)
Chloride: 104 mmol/L (ref 96–106)
Creatinine, Ser: 1.12 mg/dL — ABNORMAL HIGH (ref 0.57–1.00)
GFR calc Af Amer: 64 mL/min/{1.73_m2} (ref >59)
GFR calc non Af Amer: 56 mL/min/{1.73_m2} — ABNORMAL LOW (ref >59)
Globulin, Total: 2.7 g/dL (ref 1.5–4.5)
Glucose: 149 mg/dL — ABNORMAL HIGH (ref 65–99)
Potassium: 3.9 mmol/L (ref 3.5–5.2)
Sodium: 140 mmol/L (ref 134–144)
Total Protein: 7 g/dL (ref 6.0–8.5)

## 2020-04-19 LAB — CBC WITH DIFFERENTIAL/PLATELET
Basophils Absolute: 0 10*3/uL (ref 0.0–0.2)
Basos: 0 %
EOS (ABSOLUTE): 0.1 10*3/uL (ref 0.0–0.4)
Eos: 2 %
Hematocrit: 40.3 % (ref 34.0–46.6)
Hemoglobin: 13.1 g/dL (ref 11.1–15.9)
Immature Grans (Abs): 0 10*3/uL (ref 0.0–0.1)
Immature Granulocytes: 0 %
Lymphocytes Absolute: 2.3 10*3/uL (ref 0.7–3.1)
Lymphs: 37 %
MCH: 26.7 pg (ref 26.6–33.0)
MCHC: 32.5 g/dL (ref 31.5–35.7)
MCV: 82 fL (ref 79–97)
Monocytes Absolute: 0.5 10*3/uL (ref 0.1–0.9)
Monocytes: 7 %
Neutrophils Absolute: 3.4 10*3/uL (ref 1.4–7.0)
Neutrophils: 54 %
Platelets: 298 10*3/uL (ref 150–450)
RBC: 4.9 x10E6/uL (ref 3.77–5.28)
RDW: 14.4 % (ref 11.7–15.4)
WBC: 6.3 10*3/uL (ref 3.4–10.8)

## 2020-04-19 LAB — TSH: TSH: 2.15 u[IU]/mL (ref 0.450–4.500)

## 2020-04-19 LAB — HEPATITIS C ANTIBODY: Hep C Virus Ab: <0.1 s/co ratio (ref 0.0–0.9)

## 2020-04-19 LAB — LIPID PANEL
Chol/HDL Ratio: 3.1 ratio (ref 0.0–4.4)
Cholesterol, Total: 162 mg/dL (ref 100–199)
HDL: 52 mg/dL (ref >39)
LDL Chol Calc (NIH): 95 mg/dL (ref 0–99)
Triglycerides: 80 mg/dL (ref 0–149)
VLDL Cholesterol Cal: 15 mg/dL (ref 5–40)

## 2020-04-19 LAB — VITAMIN D 25 HYDROXY (VIT D DEFICIENCY, FRACTURES): Vit D, 25-Hydroxy: 27.2 ng/mL — ABNORMAL LOW (ref 30.0–100.0)

## 2020-05-02 ENCOUNTER — Other Ambulatory Visit: Payer: Self-pay | Admitting: Family Medicine

## 2020-05-02 DIAGNOSIS — F329 Major depressive disorder, single episode, unspecified: Secondary | ICD-10-CM

## 2020-05-02 NOTE — Telephone Encounter (Signed)
[facility] is requesting to fill pt celexa. Please advise [facility] 

## 2020-06-10 ENCOUNTER — Encounter: Payer: Self-pay | Admitting: Family Medicine

## 2020-06-10 ENCOUNTER — Telehealth: Payer: Self-pay | Admitting: Family Medicine

## 2020-06-10 ENCOUNTER — Other Ambulatory Visit: Payer: Self-pay

## 2020-06-10 ENCOUNTER — Ambulatory Visit: Payer: BC Managed Care – PPO | Admitting: Family Medicine

## 2020-06-10 VITALS — BP 140/90 | HR 96 | Temp 97.9°F | Wt 282.8 lb

## 2020-06-10 DIAGNOSIS — N3000 Acute cystitis without hematuria: Secondary | ICD-10-CM

## 2020-06-10 LAB — POCT URINALYSIS DIP (PROADVANTAGE DEVICE)
Bilirubin, UA: NEGATIVE
Glucose, UA: NEGATIVE mg/dL
Ketones, POC UA: NEGATIVE mg/dL
Nitrite, UA: NEGATIVE
Protein Ur, POC: NEGATIVE mg/dL
Specific Gravity, Urine: 1.015
Urobilinogen, Ur: 0.2
pH, UA: 6 (ref 5.0–8.0)

## 2020-06-10 MED ORDER — NITROFURANTOIN MONOHYD MACRO 100 MG PO CAPS: 100.0000 mg | ORAL_CAPSULE | Freq: Two times a day (BID) | ORAL | 0 refills | Status: DC

## 2020-06-10 NOTE — Patient Instructions (Signed)
Keep drinking plenty of fluids.  Take 2 Tylenol 4 times per day and you can also take 2 Aleve twice per day to help with the pain.  Let me know how the antibiotic works for the urine symptoms

## 2020-06-10 NOTE — Progress Notes (Signed)
   Subjective:    Patient ID: Katie Aguirre, female    DOB: [birth year], 55 y.o.   MRN: [id number]  HPI She complains of a several day history of urgency, frequency as well as right-sided back pain. The pain apparently does not subside with any urinary symptoms. No fever or chills.   Review of Systems     Objective:   Physical Exam Alert and in no distress. Urine dipstick was essentially negative. Full motion of the back. No tenderness to palpation of back or abdomen.       Assessment & Plan:  Acute cystitis without hematuria - Plan: POCT Urinalysis DIP (Proadvantage Device), nitrofurantoin, macrocrystal-monohydrate, (MACROBID) 100 MG capsule I explained that I thought her symptoms were consistent with a UTI and I will treated. Recommend Tylenol for her back pain. Explained that if the back pain does not go away, we can reevaluate that. She was comfortable with that.

## 2020-06-10 NOTE — Telephone Encounter (Signed)
Pt left a VM wanting to check the status of the antibiotic that you were sending in for her.

## 2020-06-14 ENCOUNTER — Telehealth: Payer: Self-pay

## 2020-06-14 NOTE — Telephone Encounter (Signed)
Pt called about abx for bladder infection. Pt also wanted to advised back is still hurts and she has a headache. She also advised that is not sure if the medicine is helping. Please advise [facility]

## 2020-06-14 NOTE — Telephone Encounter (Signed)
Work with her on this.  Have her take something for the headache and if still having trouble, the first week we might need to reevaluate.

## 2020-06-14 NOTE — Telephone Encounter (Signed)
Pt was made an appointment   [facility] ?

## 2020-06-17 ENCOUNTER — Encounter: Payer: Self-pay | Admitting: Family Medicine

## 2020-06-17 ENCOUNTER — Other Ambulatory Visit: Payer: Self-pay

## 2020-06-17 ENCOUNTER — Ambulatory Visit: Payer: BC Managed Care – PPO | Admitting: Family Medicine

## 2020-06-17 VITALS — BP 160/88 | HR 87 | Temp 97.7°F | Wt 280.4 lb

## 2020-06-17 DIAGNOSIS — F329 Major depressive disorder, single episode, unspecified: Secondary | ICD-10-CM

## 2020-06-17 DIAGNOSIS — R519 Headache, unspecified: Secondary | ICD-10-CM

## 2020-06-17 DIAGNOSIS — R55 Syncope and collapse: Secondary | ICD-10-CM | POA: Diagnosis not present

## 2020-06-17 NOTE — Patient Instructions (Addendum)
Take 2 Tylenol 4 times per day regularly for the next week Call me in a month.  Continue on Celexa and in counseling.

## 2020-06-17 NOTE — Progress Notes (Signed)
   Subjective:    Patient ID: Katie Aguirre, female    DOB: [birth year], 55 y.o.   MRN: [id number]  HPI She is here for multiple issues.  She describes a frontal type headache that has been intermittent in nature.  She thought it was related to taking Macrobid however the headache is not occurring at the same time she takes the [place].  She has used Tylenol with some benefit from it.  She states the headache can go from the left to the right frontal area.  She also states that last Friday she had an episode of a fluttering sensation in her chest area.  She did check her pulse at that time and states that it was 88.  She has had no more of these episodes.  She then described an episode unrelated to the above where she felt as if she was going to pass out.  EMS was called.  They did an EKG on her.  She did bring that with her.  She did become slightly panicky after that.  She also states that approximately 5 days before that she stopped taking the Effexor thinking it was causing some adverse effects and switched back to Celexa.  She did miss 1 day of the Celexa thinking the panic attack was related to that.   Review of Systems     Objective:   Physical Exam Alert and in no distress. Tympanic membranes and canals are normal. Pharyngeal area is normal. Neck is supple without adenopathy or thyromegaly. Cardiac exam shows a regular sinus rhythm without murmurs or gallops. Lungs are clear to auscultation. EKG read by me shows a rate of 88 with other indices being normal.  Compared to her previous EKG, I think the V leads for 4 5 and 6 1 was placed as it showed poor R wave progression.       Assessment & Plan:  Nonintractable headache, unspecified chronicity pattern, unspecified headache type  Syncope, unspecified syncope type  Reactive depression I told her that I thought a lot of the symptoms that she is having are related to the underlying stress and depression.  She does continue in  counseling and seems to be helping. Take 2 Tylenol 4 times per day regularly for the next week Call me in a month.  I explained that I did not think the switching to Celexa did cause her syncopal episode.  Encouraged her to continue on the Celexa and in counseling.

## 2020-07-01 ENCOUNTER — Ambulatory Visit: Payer: BC Managed Care – PPO | Admitting: Gastroenterology

## 2020-07-01 ENCOUNTER — Encounter: Payer: Self-pay | Admitting: Gastroenterology

## 2020-07-01 VITALS — BP 140/78 | HR 83 | Ht 65.0 in | Wt 281.0 lb

## 2020-07-01 DIAGNOSIS — Z1211 Encounter for screening for malignant neoplasm of colon: Secondary | ICD-10-CM

## 2020-07-01 DIAGNOSIS — K219 Gastro-esophageal reflux disease without esophagitis: Secondary | ICD-10-CM

## 2020-07-01 MED ORDER — PLENVU 140 G PO SOLR: 140.0000 g | ORAL | 0 refills | Status: DC

## 2020-07-01 NOTE — Patient Instructions (Addendum)
If you are age 55 or older, your body mass index should be between 23-30. Your Body mass index is 46.76 kg/m. If this is out of the aforementioned range listed, please consider follow up with your Primary Care Provider.  If you are age 75 or younger, your body mass index should be between 19-25. Your Body mass index is 46.76 kg/m. If this is out of the aformentioned range listed, please consider follow up with your Primary Care Provider.   You have been scheduled for an endoscopy and colonoscopy. Please follow the written instructions given to you at your visit today. Please pick up your prep supplies at the pharmacy within the next 1-3 days. If you use inhalers (even only as needed), please bring them with you on the day of your procedure.  It was a pleasure to see you today!  Dr. Loletha Carrow

## 2020-07-01 NOTE — Progress Notes (Signed)
Katie Aguirre Consult Note:  History: Katie Aguirre 07/01/2020  Referring provider: Denita Lung, MD  Reason for consult/chief complaint: Chest Pain (Pt reports chest pain, burping, and regurgitation of acid)   Subjective  HPI:  This is a very pleasant 55 year old woman referred by primary care for concerns of reflux. For least the last month she has had frequent belching, a burning chest discomfort and feelings of regurgitation.  It might occur when she is very busy and moving around the office a lot at work, but it does not happen with exertion such as using the treadmill or lifting weights at the gym.  She had an episode of it a few weeks back and felt that her heart was racing with it as well.  EMS arrived, did an EKG and she was reassured and did not go to the ED.  She saw primary care a few days later and had another EKG noted below. She denies dysphagia or odynophagia nausea or vomiting.  She had taken Tums for couple of weeks when this first started but nothing since then.  No nocturnal symptoms. Occasional constipation, no rectal bleeding.  Typically has a BM once or twice a day but sometimes feels it is difficult for her to get started. [name] was scheduled for a screening colonoscopy with me in January 2018 but canceled that procedure.  Negative Cologuard in January 2019 ROS:  Review of Systems  Constitutional: Negative for appetite change and unexpected weight change.  HENT: Negative for mouth sores and voice change.   Eyes: Negative for pain and redness.  Respiratory: Negative for cough and shortness of breath.   Cardiovascular: Negative for chest pain and palpitations.  Genitourinary: Negative for dysuria and hematuria.  Musculoskeletal: Negative for arthralgias and myalgias.  Skin: Negative for pallor and rash.  Neurological: Negative for weakness and headaches.  Hematological: Negative for adenopathy.     Past Medical  History: Past Medical History:  Diagnosis Date  . Allergy   . Anemia   . Cancer [facility]) 2004   thyroid cancer  . Hx of thyroid cancer   . Hypertension   . Polycystic ovarian syndrome   . Thyroid disease   . Vitamin D deficiency      Past Surgical History: Past Surgical History:  Procedure Laterality Date  . CESAREAN SECTION  2008  . CHOLECYSTECTOMY  2004  . THYROIDECTOMY  2005     Family History: Family History  Problem Relation Age of Onset  . Diabetes Mother   . Hyperlipidemia Mother   . Hypertension Mother   . Kidney disease Mother   . Hypertension Father   . Diabetes Father   . Colon cancer Neg Hx     Social History: Social History   Socioeconomic History  . Marital status: Married    Spouse name: Not on file  . Number of children: Not on file  . Years of education: Not on file  . Highest education level: Not on file  Occupational History  . Not on file  Tobacco Use  . Smoking status: Never Smoker  . Smokeless tobacco: Never Used  Substance and Sexual Activity  . Alcohol use: Yes    Comment: 2 drink a month  . Drug use: No  . Sexual activity: Yes  Other Topics Concern  . Not on file  Social History Narrative  . Not on file   Social Determinants of Health   Financial Resource Strain: Not on file  Food Insecurity: Not  on file  Transportation Needs: Not on file  Physical Activity: Not on file  Stress: Not on file  Social Connections: Not on file    Allergies: Allergies  Allergen Reactions  . Effexor [Venlafaxine] Other (See Comments)    Burning feeling  . Erythromycin   . Penicillins Other (See Comments)  . Sulfa Antibiotics Rash    [place] abd pain    Outpatient Meds: Current Outpatient Medications  Medication Sig Dispense Refill  . amLODipine (NORVASC) 10 MG tablet TAKE 1 TABLET(10 MG) BY MOUTH DAILY 90 tablet 3  . cholecalciferol (VITAMIN D) 1000 UNITS tablet Take 2,000 Units by mouth daily.    . citalopram (CELEXA) 20 MG tablet  TAKE 1 TABLET(20 MG) BY MOUTH DAILY (Patient taking differently: No sig reported) 90 tablet 3  . fluticasone (FLONASE) 50 MCG/ACT nasal spray INSTILL 2 SPRAYS IN EACH NOSTRIL 16 g 5  . levothyroxine (SYNTHROID) 150 MCG tablet TAKE 1 TABLET(150 MCG) BY MOUTH DAILY 90 tablet 3  . losartan-hydrochlorothiazide (HYZAAR) 100-12.5 MG tablet Take 1 tablet by mouth daily. 90 tablet 3  . nitrofurantoin, macrocrystal-monohydrate, (MACROBID) 100 MG capsule Take 1 capsule (100 mg total) by mouth 2 (two) times daily. 20 capsule 0  . PEG-KCl-NaCl-NaSulf-Na Asc-C (PLENVU) 140 g SOLR Take 140 g by mouth as directed. 1 each 0  . Polyethyl Glycol-Propyl Glycol (SYSTANE ULTRA OP) Apply 2-4 drops to eye daily.     No current facility-administered medications for this visit.      ___________________________________________________________________ Objective   Exam:  BP 140/78   Pulse 83   Ht 5\' 5"  (1.651 m)   Wt 281 lb (127.5 kg)   BMI 46.76 kg/m  Wt Readings from Last 3 Encounters:  07/01/20 281 lb (127.5 kg)  06/17/20 280 lb 6.4 oz (127.2 kg)  06/10/20 282 lb 12.8 oz (128.3 kg)   Her daughter was present for the entire visit.  General: Well-appearing, normal vocal quality  Eyes: sclera anicteric, no redness  ENT: oral mucosa moist without lesions, no cervical or supraclavicular lymphadenopathy  CV: RRR without murmur, S1/S2, no JVD, no peripheral edema  Resp: clear to auscultation bilaterally, normal RR and effort noted  GI: soft, no tenderness, with active bowel sounds. No guarding or palpable organomegaly noted, limited by body habitus  Skin; warm and dry, no rash or jaundice noted  Neuro: awake, alert and oriented x 3. Normal gross motor function and fluent speech  Labs:  CMP Latest Ref Rng & Units 04/18/2020 06/14/2019 05/04/2018  Glucose 65 - 99 mg/dL 149(H) 98 91  BUN 6 - 24 mg/dL 14 13 13   Creatinine 0.57 - 1.00 mg/dL 1.12(H) 1.07(H) 1.08(H)  Sodium 134 - 144 mmol/L 140 144 142   Potassium 3.5 - 5.2 mmol/L 3.9 4.4 4.1  Chloride 96 - 106 mmol/L 104 107(H) 103  CO2 20 - 29 mmol/L 23 25 25   Calcium 8.7 - 10.2 mg/dL 9.6 9.5 10.0  Total Protein 6.0 - 8.5 g/dL 7.0 6.8 7.5  Total Bilirubin 0.0 - 1.2 mg/dL 0.7 0.7 0.9  Alkaline Phos 44 - 121 IU/L 98 98 91  AST 0 - 40 IU/L 17 17 14   ALT 0 - 32 IU/L 20 20 18    CBC Latest Ref Rng & Units 04/18/2020 06/14/2019 05/04/2018  WBC 3.4 - 10.8 x10E3/uL 6.3 6.6 7.0  Hemoglobin 11.1 - 15.9 g/dL 13.1 12.4 13.1  Hematocrit 34.0 - 46.6 % 40.3 39.2 39.6  Platelets 150 - 450 x10E3/uL 298 300 319  Radiologic Studies:  Non-ischemic EKG with PCP 06/17/20 (reviewed)  Negative cologuard Jan 2019  Assessment: Encounter Diagnoses  Name Primary?  . Gastroesophageal reflux disease, unspecified whether esophagitis present Yes  . Special screening for malignant neoplasms, colon     Recent onset symptoms that sound like GERD, was somewhat responsive to acid suppression therapy.  Few weeks back it led to some palpitations and feelings of anxiety and she thinks she may have had a panic attack at that time as well.  It does not sound like angina and she had a nonischemic EKG with primary care as well as that done by EMS.  Plan:  Upper endoscopy and screening colonoscopy.  She was agreeable after discussion of procedure and risks.  The benefits and risks of the planned procedure were described in detail with the patient or (when appropriate) their health care proxy.  Risks were outlined as including, but not limited to, bleeding, infection, perforation, adverse medication reaction leading to cardiac or pulmonary decompensation, pancreatitis (if ERCP).  The limitation of incomplete mucosal visualization was also discussed.  No guarantees or warranties were given.  We discussed the limitation of acid suppression therapy, breadth of diet and lifestyle changes required for reflux control, the possibility of reflux related complications such as  esophagitis, stricture or Barrett's esophagus.  Other anatomic considerations such as gastric outlet obstruction or hiatal hernia may contribute to reflux.  Also BMI  Meanwhile, you she will use some OTC Pepcid Complete as needed for symptoms.   Thank you for the courtesy of this consult.  Please call me with any questions or concerns.  Nelida Meuse III  CC: Referring provider noted above

## 2020-07-05 ENCOUNTER — Other Ambulatory Visit: Payer: Self-pay | Admitting: Family Medicine

## 2020-07-05 DIAGNOSIS — E890 Postprocedural hypothyroidism: Secondary | ICD-10-CM

## 2020-07-08 DIAGNOSIS — I10 Essential (primary) hypertension: Secondary | ICD-10-CM | POA: Diagnosis not present

## 2020-07-08 DIAGNOSIS — N10 Acute tubulo-interstitial nephritis: Secondary | ICD-10-CM | POA: Diagnosis not present

## 2020-07-09 ENCOUNTER — Telehealth: Payer: Self-pay

## 2020-07-09 DIAGNOSIS — N10 Acute tubulo-interstitial nephritis: Secondary | ICD-10-CM | POA: Diagnosis not present

## 2020-07-09 NOTE — Telephone Encounter (Signed)
LVM for pt to call back for back pain. She will need an appt. Katie Aguirre

## 2020-07-19 DIAGNOSIS — N10 Acute tubulo-interstitial nephritis: Secondary | ICD-10-CM | POA: Diagnosis not present

## 2020-07-24 DIAGNOSIS — S39012A Strain of muscle, fascia and tendon of lower back, initial encounter: Secondary | ICD-10-CM | POA: Diagnosis not present

## 2020-07-24 DIAGNOSIS — I10 Essential (primary) hypertension: Secondary | ICD-10-CM | POA: Diagnosis not present

## 2020-08-11 ENCOUNTER — Other Ambulatory Visit: Payer: Self-pay | Admitting: Family Medicine

## 2020-08-11 DIAGNOSIS — I10 Essential (primary) hypertension: Secondary | ICD-10-CM

## 2020-08-15 ENCOUNTER — Encounter: Payer: Self-pay | Admitting: Gastroenterology

## 2020-08-15 ENCOUNTER — Ambulatory Visit (AMBULATORY_SURGERY_CENTER): Payer: BC Managed Care – PPO | Admitting: Gastroenterology

## 2020-08-15 ENCOUNTER — Other Ambulatory Visit: Payer: Self-pay

## 2020-08-15 VITALS — BP 137/75 | HR 61 | Temp 97.8°F | Resp 11 | Ht 65.0 in | Wt 281.0 lb

## 2020-08-15 DIAGNOSIS — R0789 Other chest pain: Secondary | ICD-10-CM | POA: Diagnosis not present

## 2020-08-15 DIAGNOSIS — D122 Benign neoplasm of ascending colon: Secondary | ICD-10-CM | POA: Diagnosis not present

## 2020-08-15 DIAGNOSIS — R12 Heartburn: Secondary | ICD-10-CM

## 2020-08-15 DIAGNOSIS — K219 Gastro-esophageal reflux disease without esophagitis: Secondary | ICD-10-CM

## 2020-08-15 DIAGNOSIS — Z1211 Encounter for screening for malignant neoplasm of colon: Secondary | ICD-10-CM | POA: Diagnosis not present

## 2020-08-15 HISTORY — PX: COLONOSCOPY: SHX174

## 2020-08-15 HISTORY — PX: UPPER GASTROINTESTINAL ENDOSCOPY: SHX188

## 2020-08-15 MED ORDER — SODIUM CHLORIDE 0.9 % IV SOLN: 500.0000 mL | Freq: Once | INTRAVENOUS | Status: DC

## 2020-08-15 NOTE — Progress Notes (Signed)
1300 Robinul 0.1 mg IV given due large amount of secretions upon assessment.  MD made aware, vss

## 2020-08-15 NOTE — Op Note (Signed)
[name] Procedure Date: 08/15/2020 1:58 PM MRN: [id number] Endoscopist: [place]. Loletha Carrow , MD Age: 55 Referring MD:  Date of Birth: 1966-05-02 Gender: Female Account #: [id number] Procedure:                Colonoscopy Indications:              Screening for colorectal malignant neoplasm, This                            is the patient's first colonoscopy Medicines:                Monitored Anesthesia Care Procedure:                Pre-Anesthesia Assessment:                           - Prior to the procedure, a History and Physical                            was performed, and patient medications and                            allergies were reviewed. The patient's tolerance of                            previous anesthesia was also reviewed. The risks                            and benefits of the procedure and the sedation                            options and risks were discussed with the patient.                            All questions were answered, and informed consent                            was obtained. Prior Anticoagulants: The patient has                            taken no previous anticoagulant or antiplatelet                            agents. ASA Grade Assessment: III - A patient with                            severe systemic disease. After reviewing the risks                            and benefits, the patient was deemed in                            satisfactory condition to undergo the procedure.  After obtaining informed consent, the colonoscope                            was passed under direct vision. Throughout the                            procedure, the patient's blood pressure, pulse, and                            oxygen saturations were monitored continuously. The                            Colonoscope was introduced through the anus and                            advanced to the the  cecum, identified by                            appendiceal orifice and ileocecal valve. The                            colonoscopy was somewhat difficult due to a                            redundant colon. Successful completion of the                            procedure was aided by using manual pressure. The                            patient tolerated the procedure well. The quality                            of the bowel preparation was excellent. The                            ileocecal valve, appendiceal orifice, and rectum                            were photographed. The bowel preparation used was                            Plenvu. Scope In: 2:12:23 PM Scope Out: 2:29:27 PM Scope Withdrawal Time: 0 hours 13 minutes 36 seconds  Total Procedure Duration: 0 hours 17 minutes 4 seconds  Findings:                 The perianal and digital rectal examinations were                            normal.                           Two semi-sessile polyps were found in the ascending  colon. The polyps were diminutive in size. These                            polyps were removed with a cold biopsy forceps.                            Resection and retrieval were complete.                           The exam was otherwise without abnormality on                            direct and retroflexion views. Complications:            No immediate complications. Estimated Blood Loss:     Estimated blood loss was minimal. Impression:               - Two diminutive polyps in the ascending colon,                            removed with a cold biopsy forceps. Resected and                            retrieved.                           - The examination was otherwise normal on direct                            and retroflexion views. Recommendation:           - Patient has a contact number available for                            emergencies. The signs and symptoms of potential                             delayed complications were discussed with the                            patient. Return to normal activities tomorrow.                            Written discharge instructions were provided to the                            patient.                           - Resume previous diet.                           - Continue present medications.                           - Await pathology results.                           -  Repeat colonoscopy is recommended for                            surveillance. The colonoscopy date will be                            determined after pathology results from today's                            exam become available for review.                           - See the other procedure note for documentation of                            additional recommendations. Katie Aguirre L. Loletha Carrow, MD 08/15/2020 2:38:55 PM This report has been signed electronically.

## 2020-08-15 NOTE — Progress Notes (Signed)
Report given to PACU, vss 

## 2020-08-15 NOTE — Op Note (Signed)
[place] Patient Name: Katie Aguirre Procedure Date: 08/15/2020 1:58 PM MRN: [id number] Endoscopist: [place]. Loletha Carrow , MD Age: 55 Referring MD:  Date of Birth: June 24, 1965 Gender: Female Account #: [id number] Procedure:                Upper GI endoscopy Indications:              Heartburn, Chest pain (non cardiac) Medicines:                Monitored Anesthesia Care Procedure:                Pre-Anesthesia Assessment:                           - Prior to the procedure, a History and Physical                            was performed, and patient medications and                            allergies were reviewed. The patient's tolerance of                            previous anesthesia was also reviewed. The risks                            and benefits of the procedure and the sedation                            options and risks were discussed with the patient.                            All questions were answered, and informed consent                            was obtained. Prior Anticoagulants: The patient has                            taken no previous anticoagulant or antiplatelet                            agents. ASA Grade Assessment: III - A patient with                            severe systemic disease. After reviewing the risks                            and benefits, the patient was deemed in                            satisfactory condition to undergo the procedure.                           After obtaining informed consent, the endoscope was  passed under direct vision. Throughout the                            procedure, the patient's blood pressure, pulse, and                            oxygen saturations were monitored continuously. The                            Endoscope was introduced through the mouth, and                            advanced to the second part of duodenum. The upper                            GI endoscopy  was accomplished without difficulty.                            The patient tolerated the procedure well. Scope In: Scope Out: Findings:                 The esophagus was normal.                           The stomach was normal.                           The cardia and gastric fundus were normal on                            retroflexion.                           The examined duodenum was normal. Complications:            No immediate complications. Estimated Blood Loss:     Estimated blood loss: none. Impression:               - Normal esophagus.                           - Normal stomach.                           - Normal examined duodenum.                           - No specimens collected.                           Patient has some symptoms sugestive of intermittent                            reflux. However, she also reports today that the                            chest pain is lately improved after better control  of anxiety. Recommendation:           - Patient has a contact number available for                            emergencies. The signs and symptoms of potential                            delayed complications were discussed with the                            patient. Return to normal activities tomorrow.                            Written discharge instructions were provided to the                            patient.                           - Resume previous diet.                           - Continue present medications.                           - See the other procedure note for documentation of                            additional recommendations.                           - Return to my office PRN. Alireza Pollack L. Loletha Carrow, MD 08/15/2020 2:42:35 PM This report has been signed electronically.

## 2020-08-15 NOTE — Patient Instructions (Signed)
Discharge instructions given. Handout on polyps. Resume previous medications. YOU HAD AN ENDOSCOPIC PROCEDURE TODAY AT [facility]:   Refer to the procedure report that was given to you for any specific questions about what was found during the examination.  If the procedure report does not answer your questions, please call your gastroenterologist to clarify.  If you requested that your care partner not be given the details of your procedure findings, then the procedure report has been included in a sealed envelope for you to review at your convenience later.  YOU SHOULD EXPECT: Some feelings of bloating in the abdomen. Passage of more gas than usual.  Walking can help get rid of the air that was put into your GI tract during the procedure and reduce the bloating. If you had a lower endoscopy (such as a colonoscopy or flexible sigmoidoscopy) you may notice spotting of blood in your stool or on the toilet paper. If you underwent a bowel prep for your procedure, you may not have a normal bowel movement for a few days.  Please Note:  You might notice some irritation and congestion in your nose or some drainage.  This is from the oxygen used during your procedure.  There is no need for concern and it should clear up in a day or so.  SYMPTOMS TO REPORT IMMEDIATELY:   Following lower endoscopy (colonoscopy or flexible sigmoidoscopy):  Excessive amounts of blood in the stool  Significant tenderness or worsening of abdominal pains  Swelling of the abdomen that is new, acute  Fever of 100F or higher   Following upper endoscopy (EGD)  Vomiting of blood or coffee ground material  New chest pain or pain under the shoulder blades  Painful or persistently difficult swallowing  New shortness of breath  Fever of 100F or higher  Black, tarry-looking stools  For urgent or emergent issues, a gastroenterologist can be reached at any hour by calling [phone]. Do not use MyChart  messaging for urgent concerns.    DIET:  We do recommend a small meal at first, but then you may proceed to your regular diet.  Drink plenty of fluids but you should avoid alcoholic beverages for 24 hours.  ACTIVITY:  You should plan to take it easy for the rest of today and you should NOT DRIVE or use heavy machinery until tomorrow (because of the sedation medicines used during the test).    FOLLOW UP: Our staff will call the number listed on your records 48-72 hours following your procedure to check on you and address any questions or concerns that you may have regarding the information given to you following your procedure. If we do not reach you, we will leave a message.  We will attempt to reach you two times.  During this call, we will ask if you have developed any symptoms of COVID 19. If you develop any symptoms (ie: fever, flu-like symptoms, shortness of breath, cough etc.) before then, please call [phone].  If you test positive for Covid 19 in the 2 weeks post procedure, please call and report this information to [place].    If any biopsies were taken you will be contacted by phone or by letter within the next 1-3 weeks.  Please call us at [phone] if you have not heard about the biopsies in 3 weeks.    SIGNATURES/CONFIDENTIALITY: You and/or your care partner have signed paperwork which will be entered into your electronic medical record.  These signatures attest  to the fact that that the information above on your After Visit Summary has been reviewed and is understood.  Full responsibility of the confidentiality of this discharge information lies with you and/or your care-partner. 

## 2020-08-15 NOTE — Progress Notes (Signed)
Called to room to assist during endoscopic procedure.  Patient ID and intended procedure confirmed with present staff. Received instructions for my participation in the procedure from the performing physician.  

## 2020-08-15 NOTE — Progress Notes (Signed)
Medical history reviewed with no changes noted. VS assessed by C.W 

## 2020-08-19 ENCOUNTER — Telehealth: Payer: Self-pay | Admitting: *Deleted

## 2020-08-19 NOTE — Telephone Encounter (Signed)
  Follow up Call-  Call back number 08/15/2020  Post procedure Call Back phone  # [phone]  Permission to leave phone message Yes  Some recent data might be hidden     Patient questions:  Do you have a fever, pain , or abdominal swelling? No. Pain Score  0 *  Have you tolerated food without any problems? Yes.    Have you been able to return to your normal activities? Yes.    Do you have any questions about your discharge instructions: Diet   No. Medications  No. Follow up visit  No.  Do you have questions or concerns about your Care? No.  Actions: * If pain score is 4 or above: 1. No action needed, pain <4.Have you developed a fever since your procedure? no  2.   Have you had an respiratory symptoms (SOB or cough) since your procedure? no  3.   Have you tested positive for COVID 19 since your procedure no  4.   Have you had any family members/close contacts diagnosed with the COVID 19 since your procedure?  on   If yes to any of these questions please route to Joylene John, RN and Joella Prince, RN

## 2020-08-22 ENCOUNTER — Encounter: Payer: Self-pay | Admitting: Gastroenterology

## 2021-01-23 ENCOUNTER — Other Ambulatory Visit: Payer: Self-pay

## 2021-01-23 ENCOUNTER — Other Ambulatory Visit: Payer: Self-pay | Admitting: Family Medicine

## 2021-01-23 ENCOUNTER — Ambulatory Visit
Admission: RE | Admit: 2021-01-23 | Discharge: 2021-01-23 | Disposition: A | Discharge status: Home / Self Care | Payer: No Typology Code available for payment source | Source: Ambulatory Visit | Attending: Family Medicine | Admitting: Family Medicine

## 2021-01-23 DIAGNOSIS — Z1231 Encounter for screening mammogram for malignant neoplasm of breast: Secondary | ICD-10-CM

## 2021-01-29 ENCOUNTER — Other Ambulatory Visit: Payer: Self-pay | Admitting: Family Medicine

## 2021-02-03 ENCOUNTER — Other Ambulatory Visit: Payer: Self-pay

## 2021-02-03 ENCOUNTER — Ambulatory Visit (INDEPENDENT_AMBULATORY_CARE_PROVIDER_SITE_OTHER): Payer: No Typology Code available for payment source | Admitting: Family Medicine

## 2021-02-03 VITALS — BP 134/76 | HR 92 | Temp 96.5°F | Wt 285.4 lb

## 2021-02-03 DIAGNOSIS — M25562 Pain in left knee: Secondary | ICD-10-CM

## 2021-02-03 DIAGNOSIS — Z23 Encounter for immunization: Secondary | ICD-10-CM

## 2021-02-03 DIAGNOSIS — M25561 Pain in right knee: Secondary | ICD-10-CM

## 2021-02-03 NOTE — Patient Instructions (Signed)
Take 2 Aleve twice per day for the next 2 weeks regularly

## 2021-02-03 NOTE — Progress Notes (Signed)
   Subjective:    Patient ID: Katie Aguirre, female    DOB: Mar 22, 1966, 55 y.o.   MRN: [id number]  HPI She complains of a 1 month history of intermittent right knee and hip discomfort.  She also describes some tingling sensation in her feet.  The pain is made better with walking.  If she sits or leans on her left side it does get worse.  She has tried Tylenol and an NSAID for the last 5 days which did relieve her symptoms however the pain started again today.   Review of Systems     Objective:   Physical Exam Alert and in no distress.  Full hip motion without pain.  Normal knee motion without pain and no palpable tenderness.       Assessment & Plan:  Acute pain of both knees  Need for influenza vaccination - Plan: Flu Vaccine QUAD 51moIM (Fluarix, Fluzone & Alfiuria Quad PF) Take 2 Aleve twice per day for the next 2 weeks regularly.  If this does not relieve her symptoms, further evaluation will be needed.  She was comfortable with that.

## 2021-02-23 ENCOUNTER — Other Ambulatory Visit: Payer: Self-pay | Admitting: Family Medicine

## 2021-02-23 DIAGNOSIS — I10 Essential (primary) hypertension: Secondary | ICD-10-CM

## 2021-04-21 ENCOUNTER — Encounter: Payer: Self-pay | Admitting: Family Medicine

## 2021-04-21 ENCOUNTER — Encounter: Payer: BC Managed Care – PPO | Admitting: Medical

## 2021-05-02 ENCOUNTER — Other Ambulatory Visit: Payer: Self-pay | Admitting: Family Medicine

## 2021-05-02 DIAGNOSIS — I10 Essential (primary) hypertension: Secondary | ICD-10-CM

## 2021-05-06 ENCOUNTER — Other Ambulatory Visit: Payer: Self-pay

## 2021-05-06 ENCOUNTER — Ambulatory Visit: Payer: No Typology Code available for payment source | Admitting: Family Medicine

## 2021-05-06 ENCOUNTER — Encounter: Payer: Self-pay | Admitting: Family Medicine

## 2021-05-06 VITALS — BP 134/82 | HR 88 | Temp 97.3°F | Ht 64.25 in | Wt 293.8 lb

## 2021-05-06 DIAGNOSIS — J309 Allergic rhinitis, unspecified: Secondary | ICD-10-CM

## 2021-05-06 DIAGNOSIS — E890 Postprocedural hypothyroidism: Secondary | ICD-10-CM | POA: Diagnosis not present

## 2021-05-06 DIAGNOSIS — F329 Major depressive disorder, single episode, unspecified: Secondary | ICD-10-CM | POA: Diagnosis not present

## 2021-05-06 DIAGNOSIS — Z0000 Encounter for general adult medical examination without abnormal findings: Secondary | ICD-10-CM

## 2021-05-06 DIAGNOSIS — Z86010 Personal history of colonic polyps: Secondary | ICD-10-CM

## 2021-05-06 DIAGNOSIS — I10 Essential (primary) hypertension: Secondary | ICD-10-CM | POA: Diagnosis not present

## 2021-05-06 DIAGNOSIS — E559 Vitamin D deficiency, unspecified: Secondary | ICD-10-CM

## 2021-05-06 MED ORDER — AMLODIPINE BESYLATE 10 MG PO TABS: 10.0000 mg | ORAL_TABLET | Freq: Every day | ORAL | 3 refills | Status: DC

## 2021-05-06 MED ORDER — CITALOPRAM HYDROBROMIDE 20 MG PO TABS: 20.0000 mg | ORAL_TABLET | Freq: Every day | ORAL | 1 refills | Status: DC

## 2021-05-06 MED ORDER — LOSARTAN POTASSIUM-HCTZ 100-12.5 MG PO TABS: 1.0000 | ORAL_TABLET | Freq: Every day | ORAL | 3 refills | Status: DC

## 2021-05-06 MED ORDER — LEVOTHYROXINE SODIUM 150 MCG PO TABS: 150.0000 ug | ORAL_TABLET | Freq: Every day | ORAL | 3 refills | Status: DC

## 2021-05-06 NOTE — Progress Notes (Signed)
° °  Subjective:    Patient ID: Katie Aguirre, female    DOB: 19-Aug-1965, 55 y.o.   MRN: [id number]  HPI She is here for a complete examination.  She has no particular concerns or complaints.  She continues on her amlodipine as well as losartan/HCTZ for her blood pressure and having no difficulty with that.  Her allergies seem to be under good control.  She continues on her Synthroid.  She also is taking Celexa and has been on this since her husband died.  She is considering coming off of this.  She rarely uses her Pepcid.  She is not very physically active.  She admits to not making much of an effort to get her weight under better control.  She has a history of colonic polyps and is scheduled for follow-up colonoscopy.  She also has a previous history of vitamin D deficiency.  Financially she is sound and that her husband took good care of her before he died.   Review of Systems  All other systems reviewed and are negative.     Objective:   Physical Exam Alert and in no distress. Tympanic membranes and canals are normal. Pharyngeal area is normal. Neck is supple without adenopathy or thyromegaly. Cardiac exam shows a regular sinus rhythm without murmurs or gallops. Lungs are clear to auscultation. Abdominal exam shows normal bowel sounds without masses or tenderness       Assessment & Plan:  Routine general medical examination at a health care facility - Plan: CBC with Differential/Platelet, Comprehensive metabolic panel, Lipid panel  Reactive depression  Essential hypertension - Plan: CBC with Differential/Platelet, Comprehensive metabolic panel  Allergic rhinitis, unspecified seasonality, unspecified trigger  Postoperative hypothyroidism - Plan: TSH  Vitamin D deficiency - Plan: VITAMIN D 25 Hydroxy (Vit-D Deficiency, Fractures)  Morbid obesity (HCC)  History of colonic polyps I again had a long discussion with her concerning diet and exercise specifically Cutting back  on carbohydrates and becoming more physically active. Discussed coming off the Celexa sometime in the spring as at this point I do not think it is really necessary.  She was comfortable with that. She will follow-up with gastroenterology. Continue to treat the allergies as needed.

## 2021-05-07 LAB — COMPREHENSIVE METABOLIC PANEL
ALT: 19 IU/L (ref 0–32)
AST: 16 IU/L (ref 0–40)
Albumin/Globulin Ratio: 1.8 (ref 1.2–2.2)
Albumin: 4.4 g/dL (ref 3.8–4.9)
Alkaline Phosphatase: 97 IU/L (ref 44–121)
BUN/Creatinine Ratio: 17 (ref 9–23)
BUN: 19 mg/dL (ref 6–24)
Bilirubin Total: 0.8 mg/dL (ref 0.0–1.2)
CO2: 25 mmol/L (ref 20–29)
Calcium: 9.4 mg/dL (ref 8.7–10.2)
Chloride: 104 mmol/L (ref 96–106)
Creatinine, Ser: 1.09 mg/dL — ABNORMAL HIGH (ref 0.57–1.00)
Globulin, Total: 2.5 g/dL (ref 1.5–4.5)
Glucose: 130 mg/dL — ABNORMAL HIGH (ref 70–99)
Potassium: 4.3 mmol/L (ref 3.5–5.2)
Sodium: 140 mmol/L (ref 134–144)
Total Protein: 6.9 g/dL (ref 6.0–8.5)
eGFR: 60 mL/min/{1.73_m2} (ref >59)

## 2021-05-07 LAB — CBC WITH DIFFERENTIAL/PLATELET
Basophils Absolute: 0 10*3/uL (ref 0.0–0.2)
Basos: 0 %
EOS (ABSOLUTE): 0.1 10*3/uL (ref 0.0–0.4)
Eos: 2 %
Hematocrit: 38.9 % (ref 34.0–46.6)
Hemoglobin: 12.3 g/dL (ref 11.1–15.9)
Immature Grans (Abs): 0 10*3/uL (ref 0.0–0.1)
Immature Granulocytes: 0 %
Lymphocytes Absolute: 1.9 10*3/uL (ref 0.7–3.1)
Lymphs: 33 %
MCH: 25.7 pg — ABNORMAL LOW (ref 26.6–33.0)
MCHC: 31.6 g/dL (ref 31.5–35.7)
MCV: 81 fL (ref 79–97)
Monocytes Absolute: 0.4 10*3/uL (ref 0.1–0.9)
Monocytes: 8 %
Neutrophils Absolute: 3.3 10*3/uL (ref 1.4–7.0)
Neutrophils: 57 %
Platelets: 293 10*3/uL (ref 150–450)
RBC: 4.78 x10E6/uL (ref 3.77–5.28)
RDW: 13.6 % (ref 11.7–15.4)
WBC: 5.8 10*3/uL (ref 3.4–10.8)

## 2021-05-07 LAB — LIPID PANEL
Chol/HDL Ratio: 3.3 ratio (ref 0.0–4.4)
Cholesterol, Total: 163 mg/dL (ref 100–199)
HDL: 49 mg/dL (ref >39)
LDL Chol Calc (NIH): 103 mg/dL — ABNORMAL HIGH (ref 0–99)
Triglycerides: 52 mg/dL (ref 0–149)
VLDL Cholesterol Cal: 11 mg/dL (ref 5–40)

## 2021-05-07 LAB — VITAMIN D 25 HYDROXY (VIT D DEFICIENCY, FRACTURES): Vit D, 25-Hydroxy: 37.1 ng/mL (ref 30.0–100.0)

## 2021-05-07 LAB — TSH: TSH: 0.542 u[IU]/mL (ref 0.450–4.500)

## 2021-05-08 LAB — SPECIMEN STATUS REPORT

## 2021-05-08 LAB — HGB A1C W/O EAG: Hgb A1c MFr Bld: 6.7 % — ABNORMAL HIGH (ref 4.8–5.6)

## 2021-05-23 ENCOUNTER — Ambulatory Visit: Payer: No Typology Code available for payment source | Admitting: Family Medicine

## 2021-05-23 ENCOUNTER — Other Ambulatory Visit: Payer: Self-pay

## 2021-05-23 ENCOUNTER — Encounter: Payer: Self-pay | Admitting: Family Medicine

## 2021-05-23 VITALS — BP 130/82 | HR 84 | Temp 97.6°F | Wt 288.2 lb

## 2021-05-23 DIAGNOSIS — E119 Type 2 diabetes mellitus without complications: Secondary | ICD-10-CM

## 2021-05-23 NOTE — Progress Notes (Signed)
° °  Subjective:    Patient ID: Katie Aguirre, female    DOB: Oct 21, 1965, 56 y.o.   MRN: [id number]  HPI She is here for consult concerning recent hemoglobin A1c of 6.7.   Review of Systems     Objective:   Physical Exam Alert and endorsed no distress otherwise not examined       Assessment & Plan:  New onset type 2 diabetes mellitus ([place]) I discussed the new diagnosis of diabetes in regard to making changes in diet, exercise.  Explained that at this point no diabetes med is necessary.  Discussed the fact that more frequent but smaller meals is indeed needed and cutting back on carbohydrates specifically.  She has already gone to the American diabetes Association website and family doctor website.  Discussed possible referral to medical weight loss and wellness if no improvement.  Discussed the fact that if she does indeed make these lifestyle changes and gets her A1c down below 5.7 we can potentially relabel her as diabetes in remission.  Recheck here in 3 to 4 months.  She was comfortable with that.  Also strongly encouraged her to give away her clothes as she strikes even further!

## 2021-06-02 ENCOUNTER — Other Ambulatory Visit: Payer: Self-pay | Admitting: Family Medicine

## 2021-06-02 DIAGNOSIS — F329 Major depressive disorder, single episode, unspecified: Secondary | ICD-10-CM

## 2021-06-02 NOTE — Telephone Encounter (Signed)
[facility] is requesting to fill pt celexa. It was fill three weeks ago. Please advise [facility]

## 2021-06-05 ENCOUNTER — Other Ambulatory Visit: Payer: Self-pay | Admitting: Family Medicine

## 2021-06-05 DIAGNOSIS — E890 Postprocedural hypothyroidism: Secondary | ICD-10-CM

## 2021-08-19 ENCOUNTER — Encounter: Payer: Self-pay | Admitting: Physician Assistant

## 2021-08-19 ENCOUNTER — Telehealth (INDEPENDENT_AMBULATORY_CARE_PROVIDER_SITE_OTHER): Payer: No Typology Code available for payment source | Admitting: Physician Assistant

## 2021-08-19 VITALS — Temp 97.1°F | Ht 65.0 in | Wt 288.0 lb

## 2021-08-19 DIAGNOSIS — U071 COVID-19: Secondary | ICD-10-CM

## 2021-08-19 MED ORDER — BENZONATATE 100 MG PO CAPS: 200.0000 mg | ORAL_CAPSULE | Freq: Three times a day (TID) | ORAL | 0 refills | Status: DC | PRN

## 2021-08-19 NOTE — Patient Instructions (Signed)
You can take an OTC expectorant like guaifenesin or Mucinexto help decrease head and nasal congestion.  ? ?Rest, increase clear fluids, OTC Tylenol (acetamenophen) for body aches, headaches, fever, chills as needed.  ? ?

## 2021-08-19 NOTE — Progress Notes (Signed)
Start time: 10:27 am ?End time: 10:45 am ? ?Virtual Visit via Video Note ? ? Patient ID: Katie Aguirre, female    DOB: 1966/02/12, 56 y.o.   MRN: [id number] ? ?I connected with above patient on 08/19/21 by a video enabled telemedicine application and verified that I am speaking with the correct person using two identifiers. ? ?Location: ?Patient: home ?Provider: office ?  ?I discussed the limitations of evaluation and management by telemedicine and the availability of in person appointments. The patient expressed understanding and agreed to proceed. ? ?History of Present Illness: ? ?Chief Complaint  ?Patient presents with  ? Cough  ?  Virtual-Coughing and runny nose Covid test done this morning and it was positive.  ? ?+ home COVID test today; Started with a runny nose 4 days ago; took [place] hbp this morning and it is a little helpful; cough started last night; denies tobacco use; denies fever / chills / nausea / vomiting / diarrhea / constipation; slept well last night, despite cough; appetite is normal; works at a daycare on occasion; worked last week and was told that some of the kids had [place]. ?  ?Observations/Objective: ? ?Temp (!) 97.1 ?F (36.2 ?C)   Ht '5\' 5"'$  (1.651 m)   Wt 288 lb (130.6 kg)   BMI 47.93 kg/m?  ? ? ?Assessment: ?Encounter Diagnosis  ?Name Primary?  ? Positive self-administered antigen test for COVID-19 Yes  ? ? ? ?Plan: ?Rest, increase clear fluids, OTC Tylenol (acetamenophen) for body aches, headaches, fever, chills as needed. Isolate at home for 1 - 2 more days. ? ? ?Caci was seen today for cough. ? ?Diagnoses and all orders for this visit: ? ?Positive self-administered antigen test for COVID-19 ? ?Other orders ?-     benzonatate (TESSALON PERLES) 100 MG capsule; Take 2 capsules (200 mg total) by mouth 3 (three) times daily as needed for cough. ? ? ? ?Follow up: as needed ? ? ?I discussed the assessment and treatment plan with the patient. The patient was provided an  opportunity to ask questions and all were answered. The patient agreed with the plan and demonstrated an understanding of the instructions. ?  ?The patient was advised to call back or seek an in-person evaluation if the symptoms worsen or if the condition fails to improve as anticipated. For emergencies go to Urgent Care or the Emergency Department for immediate evaluation.  ? ?I spent 15 minutes dedicated to the care of this patient, including pre-visit review of records, face to face time, post-visit ordering of testing and documentation. ? ? ? ?Irene Pap, PA-C ?

## 2021-08-21 ENCOUNTER — Telehealth: Payer: Self-pay | Admitting: Internal Medicine

## 2021-08-21 ENCOUNTER — Other Ambulatory Visit: Payer: Self-pay | Admitting: Physician Assistant

## 2021-08-21 DIAGNOSIS — R062 Wheezing: Secondary | ICD-10-CM

## 2021-08-21 MED ORDER — ALBUTEROL SULFATE HFA 108 (90 BASE) MCG/ACT IN AERS: 2.0000 | INHALATION_SPRAY | Freq: Four times a day (QID) | RESPIRATORY_TRACT | 0 refills | Status: DC | PRN

## 2021-08-21 NOTE — Telephone Encounter (Signed)
Pt would like something called in for wheezing. The tessalon perles have helped for coughing  ?

## 2021-08-21 NOTE — Telephone Encounter (Signed)
Pt was notified.  

## 2021-08-21 NOTE — Telephone Encounter (Signed)
I will order an albuterol inhaler for wheezing. If cough and wheezing get worse, she will need to go to Urgent Care or ED over the weekend.

## 2021-09-12 ENCOUNTER — Telehealth: Payer: Self-pay

## 2021-09-12 ENCOUNTER — Encounter: Payer: Self-pay | Admitting: Family Medicine

## 2021-09-12 ENCOUNTER — Ambulatory Visit: Payer: No Typology Code available for payment source | Admitting: Family Medicine

## 2021-09-12 VITALS — BP 140/76 | HR 72 | Temp 98.4°F | Wt 286.2 lb

## 2021-09-12 DIAGNOSIS — R058 Other specified cough: Secondary | ICD-10-CM | POA: Diagnosis not present

## 2021-09-12 NOTE — Patient Instructions (Signed)
Take 2 Prilosec nightly for the next couple of weeks and see if that will quiet your cough if it does then you can cut the back off and do it as needed ?

## 2021-09-12 NOTE — Progress Notes (Signed)
? ?  Subjective:  ? ? Patient ID: Katie Aguirre, female    DOB: 1965-07-12, 56 y.o.   MRN: [id number] ? ?HPI ?She states that since she had COVID she has continued had difficulty with a nighttime cough.  During the day it rarely occurs.  No fever, chills.  No previous history of reflux symptoms. ? ? ?Review of Systems ? ?   ?Objective:  ? Physical Exam ?Alert and in no distress.  Cardiac exam shows regular rhythm without murmurs gallops.  Lungs are clear to auscultation. ? ? ? ?   ?Assessment & Plan:  ?Nocturnal cough ?I explained that I thought her cough could easily be reflux disease and I want her to take 2 Prilosec daily for the next 2 weeks and see what effect that has.  She will keep me informed. ? ?

## 2021-09-12 NOTE — Telephone Encounter (Signed)
Pt. Called stating she wanted a refill on her benzonatate. I told her that's not something we just refill. She stated she still has a cough at night that is keeping her up.  ?

## 2021-09-22 ENCOUNTER — Ambulatory Visit: Payer: No Typology Code available for payment source | Admitting: Family Medicine

## 2021-09-22 VITALS — BP 140/80 | HR 79 | Temp 98.6°F | Wt 283.6 lb

## 2021-09-22 DIAGNOSIS — I10 Essential (primary) hypertension: Secondary | ICD-10-CM

## 2021-09-22 DIAGNOSIS — F329 Major depressive disorder, single episode, unspecified: Secondary | ICD-10-CM | POA: Diagnosis not present

## 2021-09-22 DIAGNOSIS — E119 Type 2 diabetes mellitus without complications: Secondary | ICD-10-CM

## 2021-09-22 LAB — POCT GLYCOSYLATED HEMOGLOBIN (HGB A1C): Hemoglobin A1C: 6.1 % — AB (ref 4.0–5.6)

## 2021-09-22 NOTE — Progress Notes (Signed)
?  Subjective:  ? ? Patient ID: Katie Aguirre, female    DOB: 30-Mar-1966, 56 y.o.   MRN: [id number] ? ?Katie Aguirre is a 56 y.o. female who presents for follow-up of Type 2 diabetes mellitus. ?Home blood sugar records:  not checking  ?Current symptoms/problems include none and have been stable. ?Daily foot checks:yes   Any foot concerns: none ?Exercise:  staying active for at least 20 min a day ?Diet: fair ?She is here for a recheck.  She was diagnosed with diabetes in January.  Since then she has made diet and exercise changes.  Just cut back on her high carbohydrate and also is walking on a daily basis.  She notes weight loss and has dropped down 1 dress size to a size 16.  She continues on Celexa and is very happy with that medication. ?The following portions of the patient's history were reviewed and updated as appropriate: allergies, current medications, past medical history, past social history and problem list. ? ?ROS as in subjective above. ? ?   ?Objective:  ?  ?Physical Exam ?Alert and in no distress otherwise not examined. ?Hemoglobin A1c 6.1 ?Blood pressure 140/80, pulse 79, temperature 98.6 ?F (37 ?C), weight 283 lb 9.6 oz (128.6 kg), SpO2 96 %. ? ?Lab Review ? ?  Latest Ref Rng & Units 05/06/2021  ?  3:39 PM 04/18/2020  ?  4:05 PM 06/14/2019  ?  3:26 PM 05/04/2018  ?  2:19 PM 03/31/2017  ? 12:14 PM  ?Diabetic Labs  ?HbA1c 4.8 - 5.6 % 6.7        ?Chol 100 - 199 mg/dL 163   162   173   179   168    ?HDL >39 mg/dL 49   52   51   52   54    ?Calc LDL 0 - 99 mg/dL 103   95   111   114   98    ?Triglycerides 0 - 149 mg/dL 52   80   56   63   69    ?Creatinine 0.57 - 1.00 mg/dL 1.09   1.12   1.07   1.08   1.02    ? ? ?  09/22/2021  ? 11:22 AM 09/12/2021  ?  3:35 PM 08/19/2021  ? 10:04 AM 05/23/2021  ?  1:28 PM 05/06/2021  ?  3:05 PM  ?BP/Weight  ?Systolic BP 130 865  784 696  ?Diastolic BP 80 76  82 82  ?Wt. (Lbs) 283.6 286.2 288 288.2 293.8  ?BMI 47.19 kg/m2 47.63 kg/m2 47.93 kg/m2 49.09 kg/m2 50.04  kg/m2  ? ?   ? View : No data to display.  ?  ?  ?  ? ? ?Katie Aguirre  reports that she has never smoked. She has never used smokeless tobacco. She reports current alcohol use. She reports that she does not use drugs. ? ?   ?Assessment & Plan:  ?  ?New onset type 2 diabetes mellitus ([place]) - Plan: POCT glycosylated hemoglobin (Hb A1C) ? ?Reactive depression ? ?Essential hypertension ? ?Morbid obesity ([place]) ?I congratulated her on her weight loss and encouraged her to continue with this diet and exercise regimen.  Discussed potentially setting a dress size at 16 or lower.  Recheck here in 4 months. ?

## 2021-11-02 ENCOUNTER — Other Ambulatory Visit: Payer: Self-pay | Admitting: Family Medicine

## 2021-11-02 DIAGNOSIS — I10 Essential (primary) hypertension: Secondary | ICD-10-CM

## 2021-12-08 ENCOUNTER — Other Ambulatory Visit: Payer: Self-pay | Admitting: Family Medicine

## 2021-12-08 DIAGNOSIS — E890 Postprocedural hypothyroidism: Secondary | ICD-10-CM

## 2022-01-21 ENCOUNTER — Encounter: Payer: Self-pay | Admitting: Internal Medicine

## 2022-01-23 ENCOUNTER — Ambulatory Visit: Payer: No Typology Code available for payment source | Admitting: Family Medicine

## 2022-02-07 ENCOUNTER — Other Ambulatory Visit: Payer: Self-pay | Admitting: Family Medicine

## 2022-02-07 DIAGNOSIS — I10 Essential (primary) hypertension: Secondary | ICD-10-CM

## 2022-02-23 ENCOUNTER — Other Ambulatory Visit: Payer: Self-pay | Admitting: Family Medicine

## 2022-02-23 DIAGNOSIS — Z1231 Encounter for screening mammogram for malignant neoplasm of breast: Secondary | ICD-10-CM

## 2022-02-24 ENCOUNTER — Ambulatory Visit
Admission: RE | Admit: 2022-02-24 | Discharge: 2022-02-24 | Disposition: A | Discharge status: Home / Self Care | Payer: No Typology Code available for payment source | Source: Ambulatory Visit | Attending: Family Medicine | Admitting: Family Medicine

## 2022-02-24 ENCOUNTER — Encounter: Payer: Self-pay | Admitting: Internal Medicine

## 2022-02-24 DIAGNOSIS — Z1231 Encounter for screening mammogram for malignant neoplasm of breast: Secondary | ICD-10-CM

## 2022-02-27 ENCOUNTER — Other Ambulatory Visit (INDEPENDENT_AMBULATORY_CARE_PROVIDER_SITE_OTHER): Payer: No Typology Code available for payment source

## 2022-02-27 DIAGNOSIS — Z23 Encounter for immunization: Secondary | ICD-10-CM | POA: Diagnosis not present

## 2022-03-09 ENCOUNTER — Encounter: Payer: Self-pay | Admitting: Internal Medicine

## 2022-03-13 ENCOUNTER — Other Ambulatory Visit: Payer: Self-pay | Admitting: Family Medicine

## 2022-03-13 DIAGNOSIS — E890 Postprocedural hypothyroidism: Secondary | ICD-10-CM

## 2022-05-07 ENCOUNTER — Other Ambulatory Visit: Payer: Self-pay | Admitting: Family Medicine

## 2022-05-07 DIAGNOSIS — I10 Essential (primary) hypertension: Secondary | ICD-10-CM

## 2022-05-11 ENCOUNTER — Other Ambulatory Visit: Payer: Self-pay | Admitting: Family Medicine

## 2022-05-11 DIAGNOSIS — E890 Postprocedural hypothyroidism: Secondary | ICD-10-CM

## 2022-05-12 ENCOUNTER — Encounter: Payer: No Typology Code available for payment source | Admitting: Family Medicine

## 2022-05-12 ENCOUNTER — Other Ambulatory Visit: Payer: Self-pay | Admitting: Medical

## 2022-05-12 DIAGNOSIS — E890 Postprocedural hypothyroidism: Secondary | ICD-10-CM

## 2022-05-12 NOTE — Telephone Encounter (Signed)
Has upcoming appointment in january

## 2022-06-07 ENCOUNTER — Other Ambulatory Visit: Payer: Self-pay | Admitting: Medical

## 2022-06-07 DIAGNOSIS — E890 Postprocedural hypothyroidism: Secondary | ICD-10-CM

## 2022-06-08 NOTE — Telephone Encounter (Signed)
Pt does not need this medication right now. Has upcoming appt on Wednesday

## 2022-06-10 ENCOUNTER — Ambulatory Visit: Payer: No Typology Code available for payment source | Admitting: Family Medicine

## 2022-06-10 ENCOUNTER — Encounter: Payer: Self-pay | Admitting: Family Medicine

## 2022-06-10 VITALS — BP 134/88 | HR 83 | Temp 98.0°F | Resp 18 | Ht 64.0 in | Wt 290.8 lb

## 2022-06-10 DIAGNOSIS — J309 Allergic rhinitis, unspecified: Secondary | ICD-10-CM | POA: Diagnosis not present

## 2022-06-10 DIAGNOSIS — Z85850 Personal history of malignant neoplasm of thyroid: Secondary | ICD-10-CM

## 2022-06-10 DIAGNOSIS — Z23 Encounter for immunization: Secondary | ICD-10-CM

## 2022-06-10 DIAGNOSIS — Z0000 Encounter for general adult medical examination without abnormal findings: Secondary | ICD-10-CM | POA: Diagnosis not present

## 2022-06-10 DIAGNOSIS — E119 Type 2 diabetes mellitus without complications: Secondary | ICD-10-CM | POA: Diagnosis not present

## 2022-06-10 DIAGNOSIS — E559 Vitamin D deficiency, unspecified: Secondary | ICD-10-CM

## 2022-06-10 DIAGNOSIS — E1159 Type 2 diabetes mellitus with other circulatory complications: Secondary | ICD-10-CM | POA: Diagnosis not present

## 2022-06-10 DIAGNOSIS — E785 Hyperlipidemia, unspecified: Secondary | ICD-10-CM | POA: Diagnosis not present

## 2022-06-10 DIAGNOSIS — I152 Hypertension secondary to endocrine disorders: Secondary | ICD-10-CM

## 2022-06-10 DIAGNOSIS — E890 Postprocedural hypothyroidism: Secondary | ICD-10-CM | POA: Diagnosis not present

## 2022-06-10 DIAGNOSIS — Z86010 Personal history of colonic polyps: Secondary | ICD-10-CM

## 2022-06-10 DIAGNOSIS — I10 Essential (primary) hypertension: Secondary | ICD-10-CM

## 2022-06-10 DIAGNOSIS — E1169 Type 2 diabetes mellitus with other specified complication: Secondary | ICD-10-CM

## 2022-06-10 DIAGNOSIS — M25562 Pain in left knee: Secondary | ICD-10-CM

## 2022-06-10 DIAGNOSIS — D509 Iron deficiency anemia, unspecified: Secondary | ICD-10-CM

## 2022-06-10 DIAGNOSIS — F329 Major depressive disorder, single episode, unspecified: Secondary | ICD-10-CM

## 2022-06-10 LAB — LIPID PANEL

## 2022-06-10 LAB — POCT GLYCOSYLATED HEMOGLOBIN (HGB A1C): Hemoglobin A1C: 7 % — AB (ref 4.0–5.6)

## 2022-06-10 MED ORDER — AMLODIPINE BESYLATE 10 MG PO TABS: ORAL_TABLET | ORAL | 3 refills | Status: DC

## 2022-06-10 MED ORDER — LOSARTAN POTASSIUM-HCTZ 100-12.5 MG PO TABS: 1.0000 | ORAL_TABLET | Freq: Every day | ORAL | 0 refills | Status: DC

## 2022-06-10 MED ORDER — LEVOTHYROXINE SODIUM 150 MCG PO TABS: ORAL_TABLET | ORAL | 3 refills | Status: DC

## 2022-06-10 NOTE — Progress Notes (Signed)
Complete physical exam  Patient: Katie Aguirre   DOB: [birth year]   57 y.o. Female  MRN: [id number]  Subjective:    Chief Complaint  Patient presents with   Annual Exam    Fasting.     Katie Aguirre is a 57 y.o. female who presents today for a complete physical exam. She reports consuming a general diet. Home exercise routine includes yoga and chair exercise . Also water exercise once a week. She generally feels fairly well. She reports sleeping fairly well. She does have additional problems to discuss today (popping sound in left knee when walking. Also left leg pain after excessive walking).  Will have more difficulty with feeling down and very much like when her husband died and she was taking Celexa.  She did stop it and did fairly well until this fall when she started noticing worsening of her general psychological condition.  She would like to be placed back on Celexa.  She continues on Synthroid without difficulties.  She is also taking losartan/HCTZ and amlodipine without difficulty.  She is taking Pepcid to help with her reflux which seems to work.  Her allergies seem to be under fairly good control.  She does occasionally use her albuterol inhaler.  She is taking her Synthroid and having no difficulty with that.  She does have a history of colonic polyps and is scheduled for a follow-up colonoscopy in 2027.  She also has a previous history of iron deficiency anemia as well as vitamin D deficiency.  Her weight is unchanged. Otherwise her family and social history as well as health maintenance and immunizations was reviewed.  Most recent fall risk assessment:    06/10/2022    1:52 PM  [place] in the past year? 0  Number falls in past yr: 0  Injury with Fall? 0  Risk for fall due to : No Fall Risks  Follow up Falls evaluation completed     Most recent depression screenings:    06/10/2022    1:47 PM 09/22/2021   12:01 PM  PHQ 2/9 Scores  PHQ - 2 Score  1 0  PHQ- 9 Score 6 0    Vision:Within last year and Dental: Receives regular dental care    Patient Care Team: Denita Lung, MD as PCP - General (Family Medicine)   Outpatient Medications Prior to Visit  Medication Sig   cholecalciferol (VITAMIN D) 1000 UNITS tablet Take 2,000 Units by mouth daily.   famotidine (PEPCID) 20 MG tablet Take 20 mg by mouth 2 (two) times daily.   fluticasone (FLONASE) 50 MCG/ACT nasal spray USE 2 SPRAYS IN EACH NOSTRIL AS DIRECTED   Multiple Vitamin (MULTIVITAMIN) tablet Take 1 tablet by mouth daily.   Polyethyl Glycol-Propyl Glycol (SYSTANE ULTRA OP) Apply 2-4 drops to eye daily.   citalopram (CELEXA) 20 MG tablet TAKE 1 TABLET(20 MG) BY MOUTH DAILY (Patient not taking: Reported on 06/10/2022)   [DISCONTINUED] albuterol (VENTOLIN HFA) 108 (90 Base) MCG/ACT inhaler Inhale 2 puffs into the lungs every 6 (six) hours as needed for wheezing or shortness of breath. (Patient not taking: Reported on 09/22/2021)   [DISCONTINUED] amLODipine (NORVASC) 10 MG tablet TAKE 1 TABLET(10 MG) BY MOUTH DAILY   [DISCONTINUED] benzonatate (TESSALON PERLES) 100 MG capsule Take 2 capsules (200 mg total) by mouth 3 (three) times daily as needed for cough. (Patient not taking: Reported on 09/12/2021)   [DISCONTINUED] ibuprofen (ADVIL) 800 MG tablet ibuprofen 800 mg tablet  TAKE 1 TABLET BY MOUTH THREE TIMES DAILY FOR 14 DAYS AS NEEDED (Patient not taking: Reported on 08/19/2021)   [DISCONTINUED] levothyroxine (SYNTHROID) 150 MCG tablet TAKE 1 TABLET(150 MCG) BY MOUTH DAILY BEFORE BREAKFAST   [DISCONTINUED] loratadine (CLARITIN) 10 MG tablet Take 10 mg by mouth daily.   [DISCONTINUED] losartan-hydrochlorothiazide (HYZAAR) 100-12.5 MG tablet TAKE 1 TABLET BY MOUTH DAILY   [DISCONTINUED] nitrofurantoin, macrocrystal-monohydrate, (MACROBID) 100 MG capsule Take 1 capsule (100 mg total) by mouth 2 (two) times daily. (Patient not taking: Reported on 08/15/2020)   No facility-administered  medications prior to visit.    Review of Systems  All other systems reviewed and are negative.         Objective:     BP 134/88   Pulse 83   Temp 98 F (36.7 C) (Oral)   Resp 18   Ht '5\' 4"'$  (1.626 m)   Wt 290 lb 12.8 oz (131.9 kg)   SpO2 98% Comment: room air  BMI 49.92 kg/m    Physical Exam  Alert and in no distress. Tympanic membranes and canals are normal. Pharyngeal area is normal. Neck is supple without adenopathy or thyromegaly. Cardiac exam shows a regular sinus rhythm without murmurs or gallops. Lungs are clear to auscultation.  Exam of her right knee shows no tenderness, effusion.  Anterior drawer and medial and lateral collateral ligaments intact.  [place]'s testing negative.  Results for orders placed or performed in visit on 06/10/22  POCT glycosylated hemoglobin (Hb A1C)  Result Value Ref Range   Hemoglobin A1C 7.0 (A) 4.0 - 5.6 %       Assessment & Plan:    Routine general medical examination at a health care facility - Plan: CBC with Differential/Platelet, Comprehensive metabolic panel, Lipid panel  Hypertension associated with diabetes ([place]) - Plan: CBC with Differential/Platelet, Comprehensive metabolic panel  Allergic rhinitis, unspecified seasonality, unspecified trigger  Postoperative hypothyroidism - Plan: TSH  History of colonic polyps  History of thyroid cancer - Plan: TSH  Iron deficiency anemia, unspecified iron deficiency anemia type - Plan: CBC with Differential/Platelet  Vitamin D deficiency - Plan: VITAMIN D 25 Hydroxy (Vit-D Deficiency, Fractures)  Reactive depression  New onset type 2 diabetes mellitus ([place]) - Plan: CBC with Differential/Platelet, Comprehensive metabolic panel, Lipid panel, POCT glycosylated hemoglobin (Hb A1C), Referral to Nutrition and Diabetes Services  Need for COVID-19 vaccine  Essential hypertension - Plan: losartan-hydrochlorothiazide (HYZAAR) 100-12.5 MG tablet, amLODipine (NORVASC) 10 MG  tablet  Postsurgical hypothyroidism - Plan: levothyroxine (SYNTHROID) 150 MCG tablet  Morbid obesity ([employer])  Immunization History  Administered Date(s) Administered   Influenza Split 05/05/2011   Influenza Whole 02/16/2008   Influenza,inj,Quad PF,6+ Mos 02/22/2013, 01/02/2014, 03/20/2016, 03/31/2017, 02/09/2018, 01/18/2019, 04/18/2020, 02/03/2021, 02/27/2022   Influenza-Unspecified 02/15/2013, 03/08/2019   PFIZER(Purple Top)SARS-COV-2 Vaccination 06/06/2019, 06/27/2019, 03/26/2020   Pfizer Covid-19 Vaccine Bivalent Booster 14yr & up 04/11/2021   Td 07/10/1983   Tdap 02/16/2008, 05/04/2018   Unspecified SARS-COV-2 Vaccination 06/06/2019, 06/27/2019    Health Maintenance  Topic Date Due   HIV Screening  Never done   Diabetic kidney evaluation - Urine ACR  Never done   Diabetic kidney evaluation - eGFR measurement  05/06/2022   COVID-19 Vaccine (7 - 2023-24 season) 06/26/2022 (Originally 01/16/2022)   Zoster Vaccines- Shingrix (1 of 2) 05/19/2023 (Originally 03/21/1985)   MAMMOGRAM  02/25/2024   PAP SMEAR-Modifier  07/19/2024   COLONOSCOPY (Pts 45-417yrInsurance coverage will need to be confirmed)  08/15/2025   DTaP/Tdap/Td (4 - Td or Tdap)  05/04/2028   INFLUENZA VACCINE  Completed   Hepatitis C Screening  Completed   HPV VACCINES  Aged Out   Fecal DNA (Cologuard)  Discontinued    Discussed health benefits of physical activity, and encouraged her to engage in regular exercise appropriate for her age and condition.  Also discussed dietary modification and we will send her to diabetes and nutrition.  She would rather do this and necessarily be placed on the medication.  I will continue to work with her on it.  Recommend 2 Aleve twice a day to help with her knee pain and return here if continued difficulty.  Recommend she increase her physical activity is much as she can tolerate.  She is to set up a virtual visit with me in 1 month so we can discuss the knee and her benefit from the  Celexa.  She was comfortable with that.  Problem List Items Addressed This Visit     Allergic rhinitis   History of colonic polyps   History of thyroid cancer   Relevant Orders   TSH   HTN (hypertension)   Relevant Medications   losartan-hydrochlorothiazide (HYZAAR) 100-12.5 MG tablet   amLODipine (NORVASC) 10 MG tablet   Hypothyroidism   Relevant Medications   levothyroxine (SYNTHROID) 150 MCG tablet   Other Relevant Orders   TSH   Iron deficiency anemia   Relevant Orders   CBC with Differential/Platelet   Postsurgical hypothyroidism   Relevant Medications   levothyroxine (SYNTHROID) 150 MCG tablet   Vitamin D deficiency   Relevant Orders   VITAMIN D 25 Hydroxy (Vit-D Deficiency, Fractures)   Other Visit Diagnoses     Routine general medical examination at a health care facility    -  Primary   Relevant Orders   CBC with Differential/Platelet   Comprehensive metabolic panel   Lipid panel   Reactive depression       New onset type 2 diabetes mellitus ([place])       Relevant Medications   losartan-hydrochlorothiazide (HYZAAR) 100-12.5 MG tablet   Other Relevant Orders   CBC with Differential/Platelet   Comprehensive metabolic panel   Lipid panel   POCT glycosylated hemoglobin (Hb A1C) (Completed)   Referral to Nutrition and Diabetes Services   Need for COVID-19 vaccine       Essential hypertension       Relevant Medications   losartan-hydrochlorothiazide (HYZAAR) 100-12.5 MG tablet   amLODipine (NORVASC) 10 MG tablet      No follow-ups on file.     Jill Alexanders, MD

## 2022-06-10 NOTE — Patient Instructions (Signed)
Take 2 Aleve twice per day for the next couple of weeks.  Pay attention to any particular activity that makes this better or worse and if you are still having trouble after 2 weeks and was reevaluated

## 2022-06-11 LAB — CBC WITH DIFFERENTIAL/PLATELET
Basophils Absolute: 0 10*3/uL (ref 0.0–0.2)
Basos: 1 %
EOS (ABSOLUTE): 0.1 10*3/uL (ref 0.0–0.4)
Eos: 2 %
Hematocrit: 40.1 % (ref 34.0–46.6)
Hemoglobin: 12.4 g/dL (ref 11.1–15.9)
Immature Grans (Abs): 0 10*3/uL (ref 0.0–0.1)
Immature Granulocytes: 0 %
Lymphocytes Absolute: 2.2 10*3/uL (ref 0.7–3.1)
Lymphs: 36 %
MCH: 25.4 pg — ABNORMAL LOW (ref 26.6–33.0)
MCHC: 30.9 g/dL — ABNORMAL LOW (ref 31.5–35.7)
MCV: 82 fL (ref 79–97)
Monocytes Absolute: 0.4 10*3/uL (ref 0.1–0.9)
Monocytes: 7 %
Neutrophils Absolute: 3.5 10*3/uL (ref 1.4–7.0)
Neutrophils: 54 %
Platelets: 312 10*3/uL (ref 150–450)
RBC: 4.89 x10E6/uL (ref 3.77–5.28)
RDW: 14.1 % (ref 11.7–15.4)
WBC: 6.3 10*3/uL (ref 3.4–10.8)

## 2022-06-11 LAB — LIPID PANEL
Chol/HDL Ratio: 2.6 ratio (ref 0.0–4.4)
Cholesterol, Total: 151 mg/dL (ref 100–199)
HDL: 57 mg/dL (ref >39)
LDL Chol Calc (NIH): 84 mg/dL (ref 0–99)
Triglycerides: 47 mg/dL (ref 0–149)
VLDL Cholesterol Cal: 10 mg/dL (ref 5–40)

## 2022-06-11 LAB — COMPREHENSIVE METABOLIC PANEL
ALT: 16 IU/L (ref 0–32)
AST: 16 IU/L (ref 0–40)
Albumin/Globulin Ratio: 1.8 (ref 1.2–2.2)
Albumin: 4.6 g/dL (ref 3.8–4.9)
Alkaline Phosphatase: 88 IU/L (ref 44–121)
BUN/Creatinine Ratio: 12 (ref 9–23)
BUN: 14 mg/dL (ref 6–24)
Bilirubin Total: 0.8 mg/dL (ref 0.0–1.2)
CO2: 24 mmol/L (ref 20–29)
Calcium: 10 mg/dL (ref 8.7–10.2)
Chloride: 104 mmol/L (ref 96–106)
Creatinine, Ser: 1.19 mg/dL — ABNORMAL HIGH (ref 0.57–1.00)
Globulin, Total: 2.6 g/dL (ref 1.5–4.5)
Glucose: 97 mg/dL (ref 70–99)
Potassium: 4.6 mmol/L (ref 3.5–5.2)
Sodium: 142 mmol/L (ref 134–144)
Total Protein: 7.2 g/dL (ref 6.0–8.5)
eGFR: 54 mL/min/{1.73_m2} — ABNORMAL LOW (ref >59)

## 2022-06-11 LAB — VITAMIN D 25 HYDROXY (VIT D DEFICIENCY, FRACTURES): Vit D, 25-Hydroxy: 38.4 ng/mL (ref 30.0–100.0)

## 2022-06-11 LAB — TSH: TSH: 1.63 u[IU]/mL (ref 0.450–4.500)

## 2022-06-11 MED ORDER — ROSUVASTATIN CALCIUM 20 MG PO TABS: 20.0000 mg | ORAL_TABLET | Freq: Every day | ORAL | 3 refills | Status: DC

## 2022-06-11 NOTE — Addendum Note (Signed)
Addended by: Denita Lung on: 06/11/2022 08:27 AM   Modules accepted: Orders

## 2022-06-23 ENCOUNTER — Encounter: Payer: No Typology Code available for payment source | Attending: Family Medicine | Admitting: Dietician

## 2022-06-23 ENCOUNTER — Encounter: Payer: Self-pay | Admitting: Dietician

## 2022-06-23 VITALS — Ht 64.0 in | Wt 291.0 lb

## 2022-06-23 DIAGNOSIS — E119 Type 2 diabetes mellitus without complications: Secondary | ICD-10-CM | POA: Diagnosis present

## 2022-06-23 NOTE — Progress Notes (Signed)
Patient was seen on 06/23/2022 for the first of a series of three diabetes self-management courses at the [facility].  Patient Education Plan per assessed needs and concerns is to attend three course education program for Diabetes Self Management Education.  A1C was 7% on 06/10/2022.  The following learning objectives were met by the patient during this class: Describe diabetes, types of diabetes and pathophysiology State some common risk factors for diabetes Defines the role of glucose and insulin Describe the relationship between diabetes and cardiovascular and other risks State the members of the Healthcare Team States the rationale for glucose monitoring and when to test State their individual [place] the importance of logging glucose readings and how to interpret the readings Identifies A1C target Explain the correlation between A1c and eAG values State symptoms and treatment of high blood glucose and low blood glucose Explain proper technique for glucose testing and identify proper sharps disposal  Handouts given during class include: How to Thrive:  A Guide for Your Journey with Diabetes by the ADA Meal Plan Card and carbohydrate content list Dietary intake form Low Sodium Flavoring Tips Types of Fats Dining Out Label reading Snack list The diabetes portion plate Diabetes Resources A1c to eAG Conversion Chart Blood Glucose Log Diabetes Recommended Care Schedule Support Group Diabetes Success Plan Core Class Satisfaction Survey   Follow-Up Plan: Attend core 2

## 2022-06-30 ENCOUNTER — Encounter: Payer: No Typology Code available for payment source | Admitting: Dietician

## 2022-06-30 ENCOUNTER — Encounter: Payer: Self-pay | Admitting: Dietician

## 2022-06-30 DIAGNOSIS — E119 Type 2 diabetes mellitus without complications: Secondary | ICD-10-CM

## 2022-06-30 NOTE — Progress Notes (Signed)
Patient was seen on 06/30/22 for the second of a series of three diabetes self-management courses at the [facility]. The following learning objectives were met by the patient during this class:  Describe the role of different macronutrients on glucose Explain how carbohydrates affect blood glucose State what foods contain the most carbohydrates Demonstrate carbohydrate counting Demonstrate how to read Nutrition Facts food label Describe effects of various fats on heart health Describe the importance of good nutrition for health and healthy eating strategies Describe techniques for managing your shopping, cooking and meal planning List strategies to follow meal plan when dining out Describe the effects of alcohol on glucose and how to use it safely  Goals:  Follow Diabetes Meal Plan as instructed  Aim to spread carbs evenly throughout the day  Aim for 3 meals per day and snacks as needed Include lean protein foods to meals/snacks  Monitor glucose levels as instructed by your doctor   Follow-Up Plan: Attend Core 3 Work towards following your personal food plan.

## 2022-07-07 ENCOUNTER — Encounter: Payer: No Typology Code available for payment source | Admitting: Dietician

## 2022-07-07 ENCOUNTER — Encounter: Payer: Self-pay | Admitting: Dietician

## 2022-07-07 DIAGNOSIS — E119 Type 2 diabetes mellitus without complications: Secondary | ICD-10-CM | POA: Diagnosis not present

## 2022-07-07 NOTE — Progress Notes (Signed)
Patient was seen on 07/07/2022 for the third of a series of three diabetes self-management courses at the [facility].   State the amount of activity recommended for healthy living Describe activities suitable for individual needs Identify ways to regularly incorporate activity into daily life Identify barriers to activity and ways to over come these barriers Identify diabetes medications being personally used and their primary action for lowering glucose and possible side effects Describe role of stress on blood glucose and develop strategies to address psychosocial issues Identify diabetes complications and ways to prevent them Explain how to manage diabetes during illness Evaluate success in meeting personal goal Establish 2-3 goals that they will plan to diligently work on  Goals:  I will count my carb choices at most meals and snacks I will eat less unhealthy fats by eating less red meat  Your patient has identified these potential barriers to change:  Motivation  Your patient has identified their diabetes self-care support plan as  Family Support On-line Resources [facility] Support Group  American Diabetes Association Website   Plan:  Attend Support Group as desired

## 2022-07-15 ENCOUNTER — Encounter: Payer: Self-pay | Admitting: Family Medicine

## 2022-07-15 ENCOUNTER — Telehealth: Payer: No Typology Code available for payment source | Admitting: Family Medicine

## 2022-07-15 DIAGNOSIS — F329 Major depressive disorder, single episode, unspecified: Secondary | ICD-10-CM | POA: Diagnosis not present

## 2022-07-15 DIAGNOSIS — E119 Type 2 diabetes mellitus without complications: Secondary | ICD-10-CM

## 2022-07-15 DIAGNOSIS — M25562 Pain in left knee: Secondary | ICD-10-CM | POA: Diagnosis not present

## 2022-07-15 NOTE — Progress Notes (Signed)
   Subjective:    Patient ID: Katie Aguirre, female    DOB: [birth year], 57 y.o.   MRN: [id number]  HPI Documentation for virtual audio and video telecommunications through [place] encounter:  The patient was located at home. 2 patient identifiers used.  The provider was located in the office. The patient did consent to this visit and is aware of possible charges through their insurance for this visit. The other persons participating in this telemedicine service were none. Time spent on call was 5 minutes and in review of previous records >30 minutes total for counseling and coordination of care. This virtual service is not related to other E/M service within previous 7 days.  She is here to discuss multiple issues.  She has gone to the diabetes class and had made some changes in her eating habits.  She has not checked her weight but has had difficulty with her exercise because of her left knee.  The Aleve is helping but not as much as she would like.  She also is taking Celexa every other day because she states when she takes it daily at night she wakes up slightly fatigued but does say she feels better.  Review of Systems     Objective:   Physical Exam Alert and in no distress otherwise not examined       Assessment & Plan:  Acute pain of left knee - Plan: DG Knee Complete 4 Views Right  New onset type 2 diabetes mellitus ([place])  Morbid obesity ([place])  Reactive depression I will get an x-ray on her knee and then have her return here for possible injection and referral for physical therapy. Encouraged her to continue with her diet and we will can try and improve her exercise by improving her knee. She is to take the full dosing of the Celexa so we get the full benefit of that and we can follow-up on that at a later date.

## 2022-07-16 ENCOUNTER — Ambulatory Visit
Admission: RE | Admit: 2022-07-16 | Discharge: 2022-07-16 | Disposition: A | Discharge status: Home / Self Care | Payer: No Typology Code available for payment source | Source: Ambulatory Visit | Attending: Family Medicine | Admitting: Family Medicine

## 2022-07-17 ENCOUNTER — Telehealth: Payer: Self-pay | Admitting: Family Medicine

## 2022-07-17 MED ORDER — DICLOFENAC SODIUM 75 MG PO TBEC: 75.0000 mg | DELAYED_RELEASE_TABLET | Freq: Two times a day (BID) | ORAL | 0 refills | Status: DC

## 2022-07-17 NOTE — Telephone Encounter (Signed)
Shannon called and said she needs something for her knee pain, she says the aleve and tylenol is no longer working. If you decide to send anything in she prefers [facility] [place], [address] AT [address]

## 2022-07-22 ENCOUNTER — Ambulatory Visit: Payer: No Typology Code available for payment source | Admitting: Family Medicine

## 2022-07-22 ENCOUNTER — Other Ambulatory Visit: Payer: Self-pay | Admitting: Physician Assistant

## 2022-07-22 ENCOUNTER — Encounter: Payer: Self-pay | Admitting: Family Medicine

## 2022-07-22 VITALS — BP 138/88 | HR 70 | Temp 98.1°F | Resp 16 | Wt 286.4 lb

## 2022-07-22 DIAGNOSIS — E119 Type 2 diabetes mellitus without complications: Secondary | ICD-10-CM

## 2022-07-22 DIAGNOSIS — M25562 Pain in left knee: Secondary | ICD-10-CM

## 2022-07-22 NOTE — Progress Notes (Signed)
   Subjective:    Patient ID: Katie Aguirre, female    DOB: [birth year], 57 y.o.   MRN: [id number]  HPI She is here for a recheck she had tried Tylenol as well as Aleve for her knee pain but states that since switching to Voltaren, she is 95% better.  Also she is lost approximately 6 pounds.   Review of Systems     Objective:   Physical Exam Exam of the left knee shows no swelling.  Ligaments are intact.  McMurray's testing negative.  X-rays were reviewed.       Assessment & Plan:  Acute pain of left knee - Plan: Ambulatory referral to Physical Therapy  New onset type 2 diabetes mellitus ([place]) I will send her to physical therapy for rehab program for her knee.  I complemented her on the weight loss and encouraged her to continue with this.  We will follow-up on her diabetes in about 3 months.  She was comfortable with this.

## 2022-07-30 ENCOUNTER — Other Ambulatory Visit: Payer: Self-pay | Admitting: Family Medicine

## 2022-07-30 DIAGNOSIS — F329 Major depressive disorder, single episode, unspecified: Secondary | ICD-10-CM

## 2022-08-03 ENCOUNTER — Ambulatory Visit: Payer: No Typology Code available for payment source | Admitting: Rehabilitative and Restorative Service Providers"

## 2022-09-01 ENCOUNTER — Encounter: Payer: Self-pay | Admitting: Nurse Practitioner

## 2022-09-01 ENCOUNTER — Ambulatory Visit: Payer: No Typology Code available for payment source | Admitting: Nurse Practitioner

## 2022-09-01 VITALS — BP 132/80 | HR 88 | Wt 280.0 lb

## 2022-09-01 DIAGNOSIS — L723 Sebaceous cyst: Secondary | ICD-10-CM | POA: Diagnosis not present

## 2022-09-01 NOTE — Progress Notes (Signed)
Tollie Eth, DNP, AGNP-c [facility] [address] [place], [place] [address] [phone]  Subjective:   Katie Aguirre is a 57 y.o. female presents to day for evaluation of: Sebaceous cyst on back Katie Aguirre presents today with the chief complaint of a recurrent cyst on her back, which is causing her pain. She reports that this cyst was previously drained by Dr. Susann Givens last year and has since returned.   In the past she tells me the cyst has drained on its own, but this time it has only gotten bigger with no drainage. It does seem to be more painful this time.   The patient describes the current issue with the cyst as painful, noting a difference from the past when she could press on it and smell the discharge. Jamecia denies any drainage from the cyst at this time and believes that the lack of drainage is contributing to the pain she is experiencing.   Additionally, she mentions the presence of other cysts on her body, including one under her left breast. This particular cyst occasionally itches, has grown over the past year, and she anticipates it may become bothersome in the next couple of years.  PMH, Medications, and Allergies reviewed and updated in chart as appropriate.   ROS negative except for what is listed in HPI. Objective:  BP 132/80   Pulse 88   Wt 280 lb (127 kg)   BMI 48.06 kg/m  Physical Exam Vitals and nursing note reviewed.  Constitutional:      Appearance: Normal appearance. She is obese.  HENT:     Head: Normocephalic.  Eyes:     Pupils: Pupils are equal, round, and reactive to light.  Cardiovascular:     Rate and Rhythm: Normal rate and regular rhythm.     Pulses: Normal pulses.     Heart sounds: Normal heart sounds.  Pulmonary:     Effort: Pulmonary effort is normal.     Breath sounds: Normal breath sounds.  Skin:    General: Skin is warm and dry.     Capillary Refill: Capillary refill takes less than 2 seconds.        Neurological:     General: No focal deficit present.     Mental Status: She is alert.           Assessment & Plan:   Problem List Items Addressed This Visit     Sebaceous cyst - Primary    The patient presented with a recurrent sebaceous cyst on the back, which was previously drained by Dr. Susann Givens last year. Currently, the cyst is not draining but is causing pain. Examination shows an approximate 3cm area of fluctuance with centralized T shaped scar from previous incision. A punctu is present, however, there is no drainage. At this time there appears to be no evidence of infection present. We discussed the option of incision and drainage and she would like that completed today.  Verbal consent obtained. Area cleansed with iodine and 1% lidocaine with epinephrine infiltrated into the surrounding tissue to create a block. Area was cleansed again and draped in sterile fashion. #11 blade utilized to make appx 1cm incision over the previous incised area. Due to the size, depth, and scarring a cross sectional incision was required. Incision required approximate 1 cm depth prior to reaching the lesion. Copious purulent, malodorous material drained naturally from the area initially. Manual pressure was applied to all sides to facilitate complete drainage. A sterile dermal curette was  utilized to help remove debris and the cystic sac from the area. The internal size was approximately 4 cm x 4 cm and 2.5 cm deep. Area was packed with iodoform gauze and a sterile bandage placed. The patient tolerated the procedure well without any adverse events.  Plan: - The cyst was incised, drained, and packed with a wick. Please keep the dressing in place until you return later this week to have the wick removed.  - Manage pain with Tylenol or Ibuprofen as needed. You may also use ice to the area.  - Scheduled a follow-up appointment for Friday to remove the packing and evaluate the healing.         Tollie Eth, DNP, AGNP-c 09/08/2022  6:23 PM    History, Medications, Surgery, SDOH, and Family History reviewed and updated as appropriate.

## 2022-09-04 ENCOUNTER — Encounter: Payer: Self-pay | Admitting: Nurse Practitioner

## 2022-09-04 ENCOUNTER — Ambulatory Visit: Payer: No Typology Code available for payment source | Admitting: Nurse Practitioner

## 2022-09-04 VITALS — BP 130/78 | HR 78 | Wt 282.0 lb

## 2022-09-04 DIAGNOSIS — L723 Sebaceous cyst: Secondary | ICD-10-CM

## 2022-09-04 MED ORDER — DOXYCYCLINE HYCLATE 100 MG PO TABS: 100.0000 mg | ORAL_TABLET | Freq: Two times a day (BID) | ORAL | 0 refills | Status: DC

## 2022-09-04 NOTE — Progress Notes (Signed)
  Tollie Eth, DNP, AGNP-c [facility] [address] [place], [place] [address] [phone]  Subjective:   Toleen Lachapelle is a 57 y.o. female presents to day for evaluation of: Re-evaluation following subcutaneous cyst removal earlier this week.   PMH, Medications, and Allergies reviewed and updated in chart as appropriate.   ROS negative except for what is listed in HPI. Objective:  BP 130/78   Pulse 78   Wt 282 lb (127.9 kg)   BMI 48.41 kg/m  Physical Exam Vitals and nursing note reviewed.  Constitutional:      Appearance: Normal appearance. She is obese.  HENT:     Head: Normocephalic.  Musculoskeletal:     Cervical back: Normal range of motion.  Skin:    General: Skin is warm and dry.       Neurological:     General: No focal deficit present.     Mental Status: She is alert and oriented to person, place, and time.           Assessment & Plan:   Problem List Items Addressed This Visit     Sebaceous cyst - Primary    Dressing and iodiform gauze removed pulling small amount of what appears to be remaining cystic sack. Area flushed with sterile saline and clear, pink fluid drained easily. Internal diameter of lesion considerably smaller in size with gentle probing. Area appears to be healing well. There is a small amount of erythema present around the edges of the wound, which could be due to inflammation and recent trauma. Given that ongoing healing is required, I feel it is appropriate to start short term treatment with doxycycline to ensure that no infectious process is present. Skin glue applied to hold edges of incision together. Repacking was considered, however, given the distinct reducing in internal size, I do not feel that is necessary at this time. Dressing applied and patient instructed to allow skin glue to fall off naturally. If further redness, or any warmth, drainage, or odor presents, I recommend repeat follow-up for further  evaluation.       Relevant Medications   doxycycline (VIBRA-TABS) 100 MG tablet      Tollie Eth, DNP, AGNP-c 09/13/2022  4:11 PM    History, Medications, Surgery, SDOH, and Family History reviewed and updated as appropriate.

## 2022-09-08 ENCOUNTER — Encounter: Payer: Self-pay | Admitting: Nurse Practitioner

## 2022-09-08 NOTE — Assessment & Plan Note (Signed)
The patient presented with a recurrent sebaceous cyst on the back, which was previously drained by Dr. Susann Givens last year. Currently, the cyst is not draining but is causing pain. Examination shows an approximate 3cm area of fluctuance with centralized T shaped scar from previous incision. A punctu is present, however, there is no drainage. At this time there appears to be no evidence of infection present. We discussed the option of incision and drainage and she would like that completed today.  Verbal consent obtained. Area cleansed with iodine and 1% lidocaine with epinephrine infiltrated into the surrounding tissue to create a block. Area was cleansed again and draped in sterile fashion. #11 blade utilized to make appx 1cm incision over the previous incised area. Due to the size, depth, and scarring a cross sectional incision was required. Incision required approximate 1 cm depth prior to reaching the lesion. Copious purulent, malodorous material drained naturally from the area initially. Manual pressure was applied to all sides to facilitate complete drainage. A sterile dermal curette was utilized to help remove debris and the cystic sac from the area. The internal size was approximately 4 cm x 4 cm and 2.5 cm deep. Area was packed with iodoform gauze and a sterile bandage placed. The patient tolerated the procedure well without any adverse events.  Plan: - The cyst was incised, drained, and packed with a wick. Please keep the dressing in place until you return later this week to have the wick removed.  - Manage pain with Tylenol or Ibuprofen as needed. You may also use ice to the area.  - Scheduled a follow-up appointment for Friday to remove the packing and evaluate the healing.

## 2022-09-09 ENCOUNTER — Other Ambulatory Visit: Payer: Self-pay | Admitting: Family Medicine

## 2022-09-13 NOTE — Assessment & Plan Note (Signed)
Dressing and iodiform gauze removed pulling small amount of what appears to be remaining cystic sack. Area flushed with sterile saline and clear, pink fluid drained easily. Internal diameter of lesion considerably smaller in size with gentle probing. Area appears to be healing well. There is a small amount of erythema present around the edges of the wound, which could be due to inflammation and recent trauma. Given that ongoing healing is required, I feel it is appropriate to start short term treatment with doxycycline to ensure that no infectious process is present. Skin glue applied to hold edges of incision together. Repacking was considered, however, given the distinct reducing in internal size, I do not feel that is necessary at this time. Dressing applied and patient instructed to allow skin glue to fall off naturally. If further redness, or any warmth, drainage, or odor presents, I recommend repeat follow-up for further evaluation.

## 2022-10-27 ENCOUNTER — Ambulatory Visit: Payer: No Typology Code available for payment source | Admitting: Family Medicine

## 2022-11-04 ENCOUNTER — Other Ambulatory Visit: Payer: Self-pay | Admitting: Family Medicine

## 2022-11-04 DIAGNOSIS — I10 Essential (primary) hypertension: Secondary | ICD-10-CM

## 2022-12-14 ENCOUNTER — Other Ambulatory Visit: Payer: Self-pay | Admitting: Family Medicine

## 2022-12-14 NOTE — Telephone Encounter (Signed)
Last apt 09/04/22.

## 2022-12-19 ENCOUNTER — Other Ambulatory Visit: Payer: Self-pay | Admitting: Family Medicine

## 2022-12-19 DIAGNOSIS — I10 Essential (primary) hypertension: Secondary | ICD-10-CM

## 2023-01-07 ENCOUNTER — Ambulatory Visit: Payer: No Typology Code available for payment source | Admitting: Family Medicine

## 2023-02-12 ENCOUNTER — Other Ambulatory Visit: Payer: Self-pay | Admitting: Family Medicine

## 2023-02-12 DIAGNOSIS — I10 Essential (primary) hypertension: Secondary | ICD-10-CM

## 2023-02-16 ENCOUNTER — Other Ambulatory Visit: Payer: Self-pay | Admitting: Family Medicine

## 2023-02-16 DIAGNOSIS — F329 Major depressive disorder, single episode, unspecified: Secondary | ICD-10-CM

## 2023-03-04 ENCOUNTER — Other Ambulatory Visit: Payer: Self-pay | Admitting: Family Medicine

## 2023-03-04 DIAGNOSIS — Z1231 Encounter for screening mammogram for malignant neoplasm of breast: Secondary | ICD-10-CM

## 2023-03-16 ENCOUNTER — Ambulatory Visit: Payer: No Typology Code available for payment source | Admitting: Family Medicine

## 2023-03-16 ENCOUNTER — Telehealth: Payer: Self-pay | Admitting: Internal Medicine

## 2023-03-16 ENCOUNTER — Other Ambulatory Visit: Payer: No Typology Code available for payment source

## 2023-03-16 ENCOUNTER — Encounter: Payer: Self-pay | Admitting: Family Medicine

## 2023-03-16 VITALS — BP 138/84 | HR 81 | Ht 65.0 in | Wt 281.8 lb

## 2023-03-16 DIAGNOSIS — I152 Hypertension secondary to endocrine disorders: Secondary | ICD-10-CM

## 2023-03-16 DIAGNOSIS — Z23 Encounter for immunization: Secondary | ICD-10-CM | POA: Diagnosis not present

## 2023-03-16 DIAGNOSIS — E1159 Type 2 diabetes mellitus with other circulatory complications: Secondary | ICD-10-CM

## 2023-03-16 DIAGNOSIS — E1169 Type 2 diabetes mellitus with other specified complication: Secondary | ICD-10-CM

## 2023-03-16 LAB — POCT GLYCOSYLATED HEMOGLOBIN (HGB A1C): Hemoglobin A1C: 6 % — AB (ref 4.0–5.6)

## 2023-03-16 NOTE — Telephone Encounter (Signed)
Pt came in for a nurse visit to compare BP cuff . Please advise  Her cuff is 140/80 Our cuff 138/78

## 2023-03-16 NOTE — Telephone Encounter (Signed)
Pt was notified.  

## 2023-03-16 NOTE — Progress Notes (Signed)
   Subjective:    Patient ID: Katie Aguirre, female    DOB: 09-Jun-1965, 57 y.o.   MRN: [id number]  HPI She is here for a recheck.  She has made diet and exercise changes and feels very good about this.  She rarely uses her Voltaren.  She also has stopped taking the Celexa approximately 6 weeks ago stating she psychologically seems to be in a much better spot.  A lot of this revolves around the death of her husband.  She does have a blood pressure cuff at home but has not measured against ours.  Continues on losartan.  Is not on any diabetes medications.  Does not smoke or drink.   Review of Systems     Objective:    Physical Exam Alert and in no distress.  Hemoglobin A1c is 6.0.  She has lost approximately 8 pounds.       Assessment & Plan:   Type 2 diabetes mellitus with other specified complication, without long-term current use of insulin (HCC) - Plan: HgB A1c, CANCELED: POCT UA - Microalbumin  Need for influenza vaccination - Plan: Flu vaccine trivalent PF, 6mos and older(Flulaval,Afluria,Fluarix,Fluzone)  Morbid obesity (HCC) I congratulated her on the work that she has done in recommend she continue to work on this.  At this point no need to start back on the Celexa.  Did recommend she bring her blood pressure cuff in and measure it against ours since her blood pressure was slightly elevated.

## 2023-03-19 ENCOUNTER — Other Ambulatory Visit: Payer: Self-pay | Admitting: Family Medicine

## 2023-03-19 NOTE — Telephone Encounter (Signed)
Last apt 03/16/23.

## 2023-03-23 IMAGING — MG MM DIGITAL SCREENING BILAT W/ TOMO AND CAD
8 series · 8 of 24 positions shown · non-contrast
Comparison: Previous exam(s).

CLINICAL DATA: Screening.

EXAM:
DIGITAL SCREENING BILATERAL MAMMOGRAM WITH TOMOSYNTHESIS AND CAD
TECHNIQUE: Bilateral screening digital craniocaudal and mediolateral oblique
mammograms were obtained. Bilateral screening digital breast
tomosynthesis was performed. The images were evaluated with
computer-aided detection.

[L CC synth-2D]
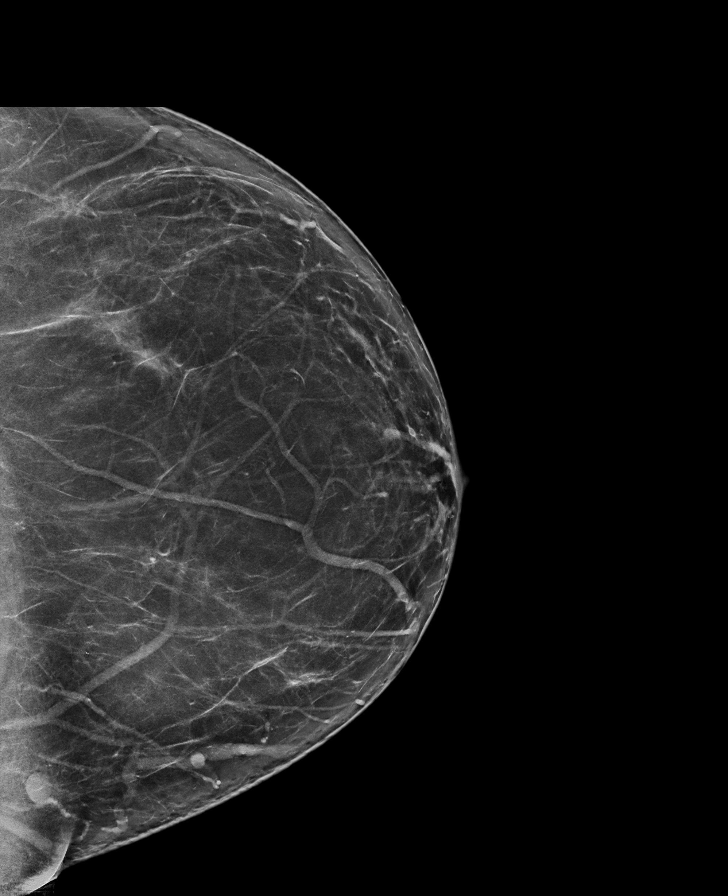

[L MLO synth-2D]
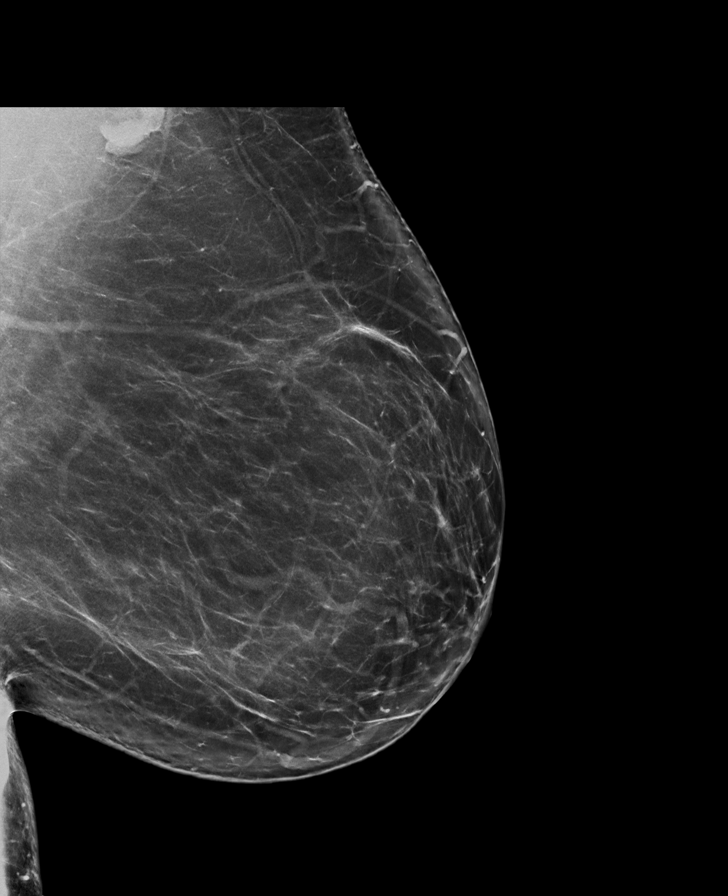

[R MLO synth-2D]
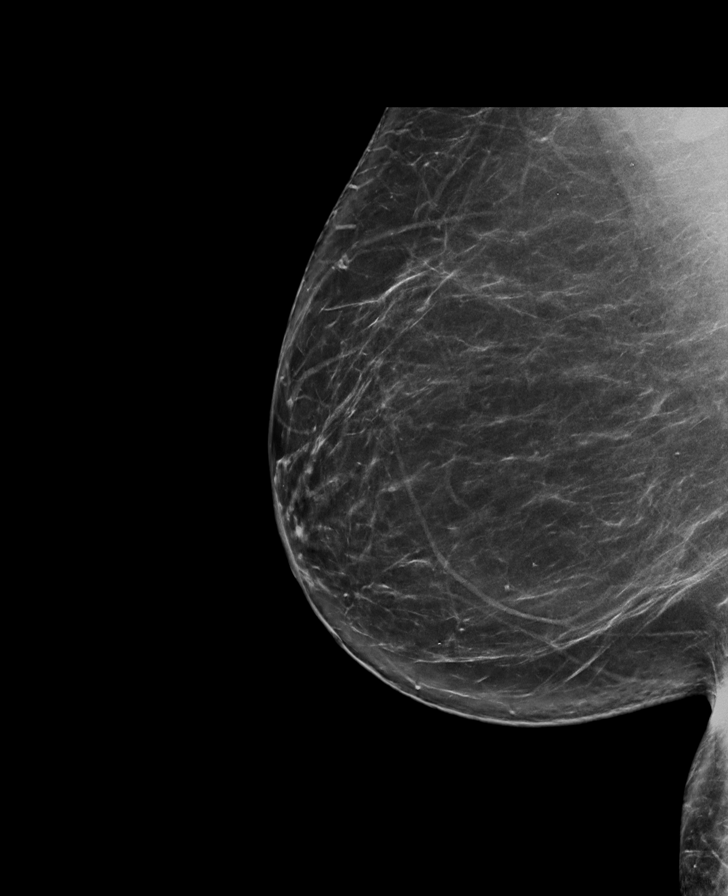

[R CC synth-2D]
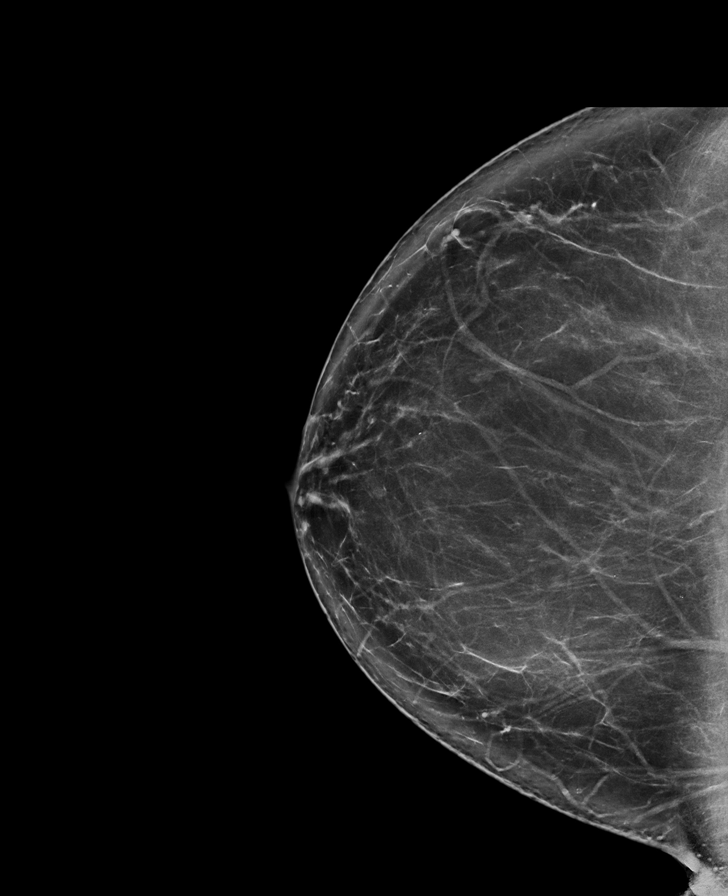

[L CC tomo · tomo slice 44/87.0]
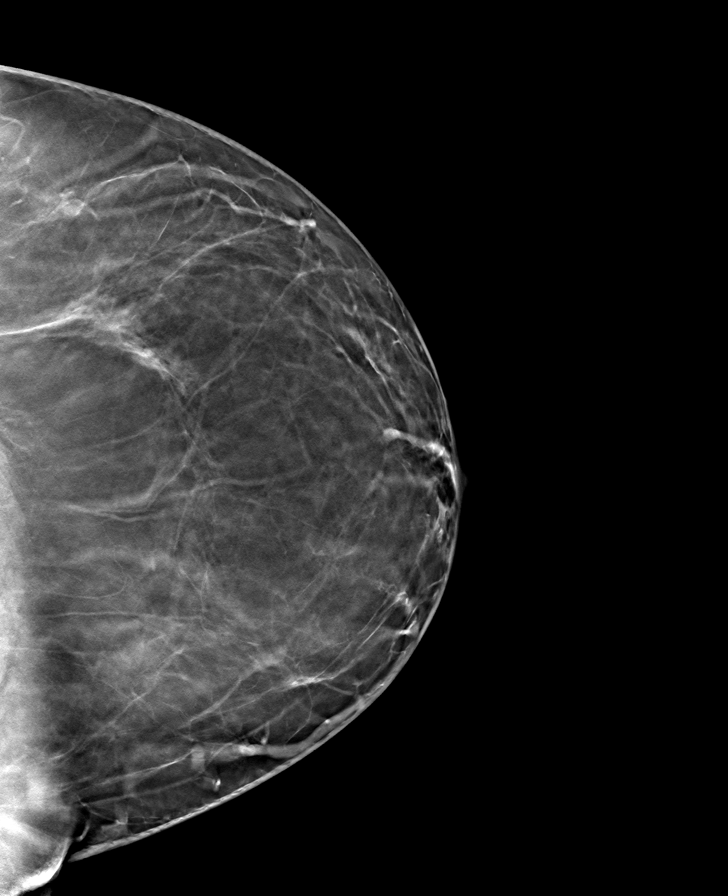

[L MLO tomo · tomo slice 51/100.0]
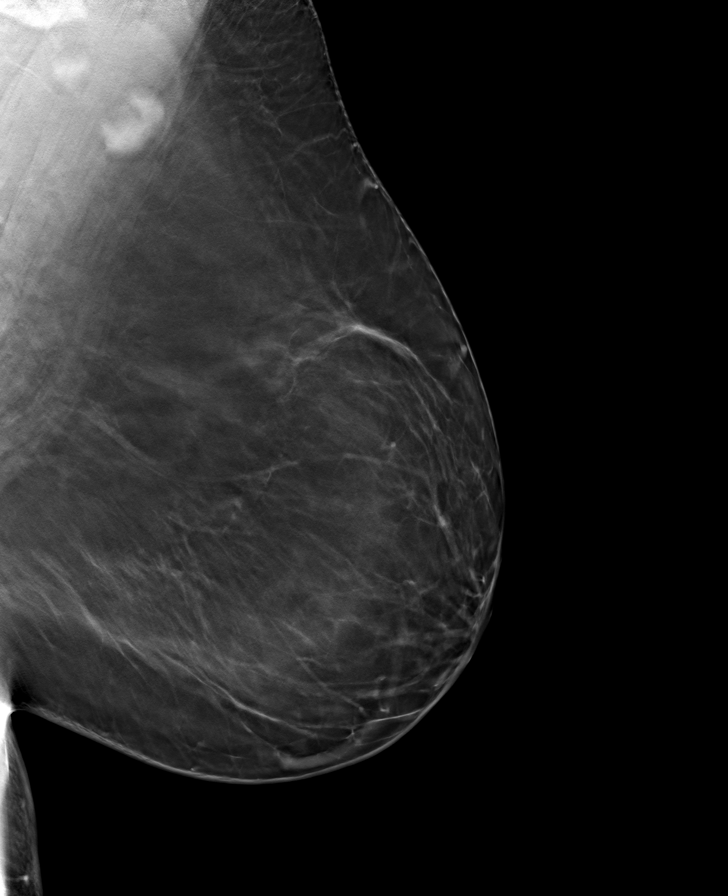

[R MLO tomo · tomo slice 49/97.0]
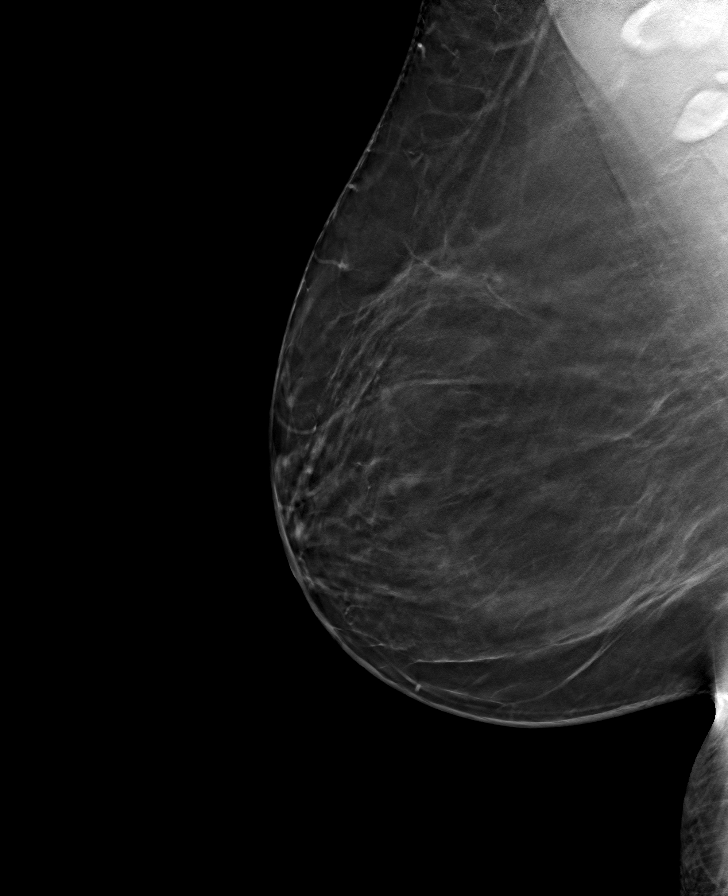

[R CC tomo · tomo slice 48/95.0]
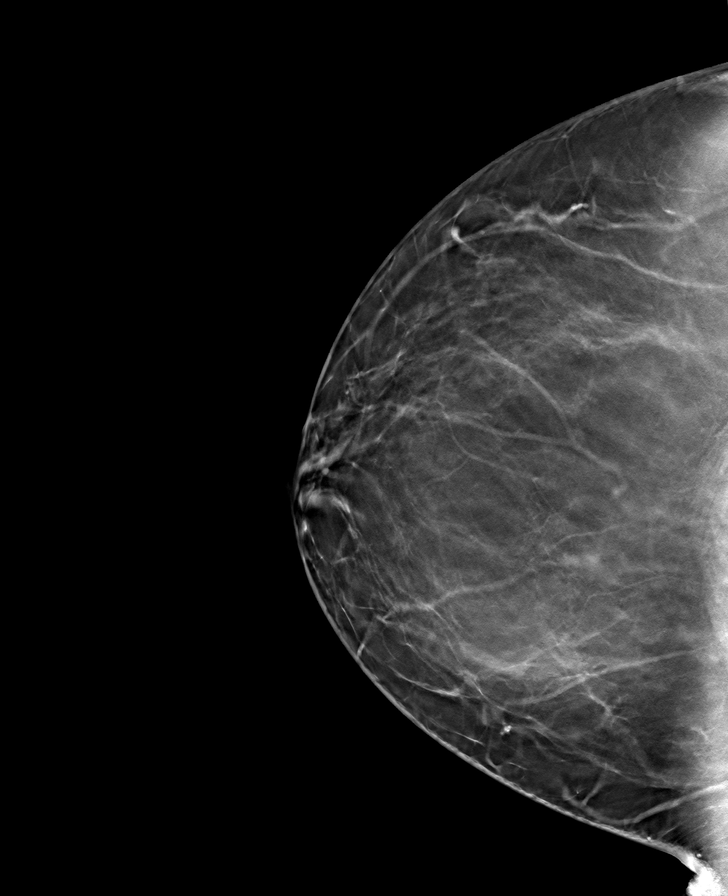

[8 of 24 positions shown; findings below may reference images not displayed]

ACR Breast Density Category b: There are scattered areas of
fibroglandular density.
FINDINGS: There are no findings suspicious for malignancy.
IMPRESSION: No mammographic evidence of malignancy. A result letter of this
screening mammogram will be mailed directly to the patient.

RECOMMENDATION:
Screening mammogram in one year. (Code:51-O-LD2)

BI-RADS CATEGORY  1: Negative.

## 2023-04-01 ENCOUNTER — Ambulatory Visit: Payer: No Typology Code available for payment source

## 2023-04-12 ENCOUNTER — Telehealth: Payer: Self-pay | Admitting: Family Medicine

## 2023-04-12 NOTE — Telephone Encounter (Signed)
Pt dropped off a medical report form to be filled out. Was placed in Dr.Lalondes folder

## 2023-04-13 NOTE — Telephone Encounter (Signed)
Left meesage for patient that form is ready to be picked up

## 2023-04-14 ENCOUNTER — Ambulatory Visit
Admission: RE | Admit: 2023-04-14 | Discharge: 2023-04-14 | Disposition: A | Discharge status: Home / Self Care | Payer: No Typology Code available for payment source | Source: Ambulatory Visit | Attending: Family Medicine | Admitting: Family Medicine

## 2023-04-14 DIAGNOSIS — Z1231 Encounter for screening mammogram for malignant neoplasm of breast: Secondary | ICD-10-CM

## 2023-04-20 ENCOUNTER — Other Ambulatory Visit: Payer: Self-pay | Admitting: Family Medicine

## 2023-04-20 DIAGNOSIS — R928 Other abnormal and inconclusive findings on diagnostic imaging of breast: Secondary | ICD-10-CM

## 2023-04-22 ENCOUNTER — Ambulatory Visit
Admission: RE | Admit: 2023-04-22 | Discharge: 2023-04-22 | Disposition: A | Discharge status: Home / Self Care | Payer: No Typology Code available for payment source | Source: Ambulatory Visit | Attending: Family Medicine | Admitting: Family Medicine

## 2023-04-22 DIAGNOSIS — R928 Other abnormal and inconclusive findings on diagnostic imaging of breast: Secondary | ICD-10-CM

## 2023-04-28 ENCOUNTER — Other Ambulatory Visit: Payer: Self-pay | Admitting: Family Medicine

## 2023-06-11 ENCOUNTER — Ambulatory Visit: Payer: No Typology Code available for payment source | Admitting: Medical

## 2023-06-11 VITALS — BP 120/74 | HR 92 | Temp 97.7°F | Wt 286.2 lb

## 2023-06-11 DIAGNOSIS — L729 Follicular cyst of the skin and subcutaneous tissue, unspecified: Secondary | ICD-10-CM | POA: Diagnosis not present

## 2023-06-11 DIAGNOSIS — L089 Local infection of the skin and subcutaneous tissue, unspecified: Secondary | ICD-10-CM

## 2023-06-11 MED ORDER — DOXYCYCLINE HYCLATE 100 MG PO TABS
100.0000 mg | ORAL_TABLET | Freq: Two times a day (BID) | ORAL | 0 refills | Status: AC
Start: 2023-06-11 — End: ?

## 2023-06-11 NOTE — Patient Instructions (Signed)
Recommendations: Begin Doxycycline antibiotic twice daily for 7-10 days Begin over the counter Ibuprofen 200mg , 3 tablets twice daily for 3-5 days for pain and inflammation, short term If you don't want to use ibuprofen, then just stick with tylenol for pain Use warm compresses multiple times this weekend If drainage occurs, cover with gauze Keep area clean with soap and water If worse and not improving by next week, or if raised up red swollen area, then come back

## 2023-06-11 NOTE — Progress Notes (Addendum)
Subjective:  Katie Aguirre is a 58 y.o. female who presents for Chief Complaint  Patient presents with   Cyst    Cyst under breast x a few months, red, painful, bigger since mammogram in Decemeber, some drainage when squeezing     Here for complaint of cyst under her breast.  She thinks she has had a little bump there for a few months but she had a mammogram and subsequent ultrasound in December and that seemed to aggravate things.  He has been a little tender in the past week or so.  May have gotten a slight bit of drainage.  No fever, no bodyaches or chills.  She has a history of other cyst.  She had an abscess drained on her left chest wall under the breast in the past and she has had abscesses on her back before.  No other aggravating or relieving factors.    No other c/o.  The following portions of the patient's history were reviewed and updated as appropriate: allergies, current medications, past family history, past medical history, past social history, past surgical history and problem list.  ROS Otherwise as in subjective above  Objective: BP 120/74   Pulse 92   Temp 97.7 F (36.5 C)   Wt 286 lb 3.2 oz (129.8 kg)   SpO2 98%   BMI 47.63 kg/m   General appearance: alert, no distress, well developed, well nourished Under the fold under the right medial breast inferiorly is a tender area of slight induration approximately 1-1/2 cm diameter but no fluctuance, no discoloration, no drainage.  Otherwise no obvious skin change Exam chaperoned by nurse   Assessment: Encounter Diagnoses  Name Primary?   Cyst of skin Yes   Skin infection      Plan: We discussed the findings.  I reviewed her mammogram and ultrasound from December 2024 and there was a 1 cm sebaceous/epidermoid cyst in the area where she is tender today.  We discussed options including antibiotic and warm compresses, anti-inflammatory, possible I&D.  No obvious abscess at this point in the area is  fairly small.  She is agreeable to antibiotics, warm compresses and anti-inflammatory initially.   Recommendations: Begin Doxycycline antibiotic twice daily for 7-10 days Begin over the counter Ibuprofen 200mg , 3 tablets twice daily for 3-5 days for pain and inflammation, short term If you don't want to use ibuprofen, then just stick with tylenol for pain Use warm compresses multiple times this weekend If drainage occurs, cover with gauze Keep area clean with soap and water If worse and not improving by next week, or if raised up red swollen area, then come back  Advised if any significant changes of the week and as discussed including significant discoloration, worse pain, swelling, fever, or overall just much worse, then go get checked out in urgent care or emergency department  Katie Aguirre was seen today for cyst.  Diagnoses and all orders for this visit:  Cyst of skin  Skin infection  Other orders -     doxycycline (VIBRA-TABS) 100 MG tablet; Take 1 tablet (100 mg total) by mouth 2 (two) times daily.    Follow up: prn

## 2023-06-12 ENCOUNTER — Other Ambulatory Visit: Payer: Self-pay | Admitting: Family Medicine

## 2023-06-29 ENCOUNTER — Other Ambulatory Visit: Payer: Self-pay | Admitting: Family Medicine

## 2023-06-29 DIAGNOSIS — I10 Essential (primary) hypertension: Secondary | ICD-10-CM

## 2023-06-29 DIAGNOSIS — E890 Postprocedural hypothyroidism: Secondary | ICD-10-CM

## 2023-07-01 ENCOUNTER — Other Ambulatory Visit: Payer: Self-pay | Admitting: Family Medicine

## 2023-07-13 ENCOUNTER — Encounter: Payer: Self-pay | Admitting: Internal Medicine

## 2023-09-14 ENCOUNTER — Ambulatory Visit: Payer: No Typology Code available for payment source | Admitting: Family Medicine

## 2023-09-22 DIAGNOSIS — E1165 Type 2 diabetes mellitus with hyperglycemia: Secondary | ICD-10-CM | POA: Insufficient documentation

## 2023-09-22 DIAGNOSIS — E119 Type 2 diabetes mellitus without complications: Secondary | ICD-10-CM | POA: Insufficient documentation

## 2023-09-23 ENCOUNTER — Ambulatory Visit: Admitting: Family Medicine

## 2023-09-23 VITALS — BP 128/90 | HR 81 | Wt 287.4 lb

## 2023-09-23 DIAGNOSIS — E785 Hyperlipidemia, unspecified: Secondary | ICD-10-CM | POA: Diagnosis not present

## 2023-09-23 DIAGNOSIS — E1159 Type 2 diabetes mellitus with other circulatory complications: Secondary | ICD-10-CM | POA: Diagnosis not present

## 2023-09-23 DIAGNOSIS — I152 Hypertension secondary to endocrine disorders: Secondary | ICD-10-CM

## 2023-09-23 DIAGNOSIS — E890 Postprocedural hypothyroidism: Secondary | ICD-10-CM

## 2023-09-23 DIAGNOSIS — E559 Vitamin D deficiency, unspecified: Secondary | ICD-10-CM | POA: Diagnosis not present

## 2023-09-23 DIAGNOSIS — Z23 Encounter for immunization: Secondary | ICD-10-CM

## 2023-09-23 DIAGNOSIS — E1169 Type 2 diabetes mellitus with other specified complication: Secondary | ICD-10-CM

## 2023-09-23 DIAGNOSIS — F329 Major depressive disorder, single episode, unspecified: Secondary | ICD-10-CM

## 2023-09-23 DIAGNOSIS — F325 Major depressive disorder, single episode, in full remission: Secondary | ICD-10-CM | POA: Diagnosis not present

## 2023-09-23 LAB — LIPID PANEL

## 2023-09-23 LAB — POCT UA - MICROALBUMIN
Albumin/Creatinine Ratio, Urine, POC: 35.6
Creatinine, POC: 153.7 mg/dL
Microalbumin Ur, POC: 23.2 mg/L

## 2023-09-23 LAB — POCT GLYCOSYLATED HEMOGLOBIN (HGB A1C): Hemoglobin A1C: 6.2 % — AB (ref 4.0–5.6)

## 2023-09-23 MED ORDER — LOSARTAN POTASSIUM-HCTZ 100-12.5 MG PO TABS: 1.0000 | ORAL_TABLET | Freq: Every day | ORAL | 0 refills | Status: DC

## 2023-09-23 MED ORDER — AMLODIPINE BESYLATE 10 MG PO TABS: ORAL_TABLET | ORAL | 0 refills | Status: AC

## 2023-09-23 NOTE — Progress Notes (Addendum)
 Subjective:    Patient ID: Katie Aguirre, female    DOB: 10-14-1965, 58 y.o.   MRN: [id number]  Katie Aguirre is a 58 y.o. female who presents for follow-up of Type 2 diabetes mellitus.  Home blood sugar records: does not record Current symptoms/problems include none and have been unchanged. Daily foot checks: no   Any foot concerns: no How often blood sugars checked: does not check  Exercise: Home exercise routine includes walking 1 hrs per day. Diet:reg  She is doing well psychologically.  This is all from the death of her husband several years ago.  She continues on amlodipine  and losartan /HCTZ and have no difficulty with that.  She is also taking her thyroid  medication.  She does have a history of vitamin D  deficiency. She is no longer taking Celexa  and is doing quite The following portions of the patient's history were reviewed and updated as appropriate: allergies, current medications, past medical history, past social history and problem list.  ROS as in subjective above.     Objective:     Physical Exam Alert and in no distress diabetic foot exam is recorded and is normal.  Hemoglobin A1c is 6.2 Blood pressure (!) 128/90, pulse 81, weight 287 lb 6.4 oz (130.4 kg), SpO2 97%.  Lab Review    Latest Ref Rng & Units 03/16/2023    9:59 AM 06/10/2022    2:42 PM 06/10/2022    2:00 PM 09/22/2021   11:38 AM 05/06/2021    3:39 PM  Diabetic Labs  HbA1c 4.0 - 5.6 % 6.0   7.0  6.1  6.7   Chol 100 - 199 mg/dL  626    948   HDL >54 mg/dL  57    49   Calc LDL 0 - 99 mg/dL  84    627   Triglycerides 0 - 149 mg/dL  47    52   Creatinine 0.57 - 1.00 mg/dL  0.35    0.09       07/24/1827    8:49 AM 06/11/2023   11:10 AM 03/16/2023    8:40 AM 09/04/2022    1:35 PM 09/01/2022   10:42 AM  BP/Weight  Systolic BP 128 120 138 130 132  Diastolic BP 90 74 84 78 80  Wt. (Lbs) 287.4 286.2 281.8 282 280  BMI 47.83 kg/m2 47.63 kg/m2 46.89 kg/m2 48.41 kg/m2 48.06 kg/m2        No data to display          Katie Aguirre  reports that she has never smoked. She has never used smokeless tobacco. She reports current alcohol use. She reports that she does not use drugs.     Assessment & Plan:    Type 2 diabetes mellitus with other specified complication, without long-term current use of insulin (HCC) - Plan: POCT glycosylated hemoglobin (Hb A1C), POCT UA - Microalbumin  Vitamin D  deficiency  Depression, major, in remission (HCC)  Postsurgical hypothyroidism - Plan: TSH  Hypertension associated with diabetes (HCC) - Plan: CBC with Differential/Platelet, Comprehensive metabolic panel with GFR, losartan -hydrochlorothiazide  (HYZAAR) 100-12.5 MG tablet, amLODipine  (NORVASC ) 10 MG tablet  Need for vaccination against Streptococcus pneumoniae - Plan: Pneumococcal conjugate vaccine 20-valent (Prevnar 20)  Need for shingles vaccine - Plan: Varicella-zoster vaccine IM  Morbid obesity (HCC)  Hyperlipidemia associated with type 2 diabetes mellitus (HCC) - Plan: Lipid panel A/C ratio is above 30 however I will check this again in a year for accuracy. At this point she  seems to be doing quite nicely on her present medication regimen.  I will update her immunizations.  Discussed the possible use of the GLP-1 but at this point she is not interested.  Recheck here in about 6 months.

## 2023-09-24 ENCOUNTER — Encounter: Payer: Self-pay | Admitting: Family Medicine

## 2023-09-24 LAB — CBC WITH DIFFERENTIAL/PLATELET
Basophils Absolute: 0 10*3/uL (ref 0.0–0.2)
Basos: 1 %
EOS (ABSOLUTE): 0.1 10*3/uL (ref 0.0–0.4)
Eos: 2 %
Hematocrit: 37.5 % (ref 34.0–46.6)
Hemoglobin: 11.7 g/dL (ref 11.1–15.9)
Immature Grans (Abs): 0 10*3/uL (ref 0.0–0.1)
Immature Granulocytes: 0 %
Lymphocytes Absolute: 1.4 10*3/uL (ref 0.7–3.1)
Lymphs: 27 %
MCH: 26.3 pg — ABNORMAL LOW (ref 26.6–33.0)
MCHC: 31.2 g/dL — ABNORMAL LOW (ref 31.5–35.7)
MCV: 84 fL (ref 79–97)
Monocytes Absolute: 0.3 10*3/uL (ref 0.1–0.9)
Monocytes: 6 %
Neutrophils Absolute: 3.3 10*3/uL (ref 1.4–7.0)
Neutrophils: 64 %
Platelets: 301 10*3/uL (ref 150–450)
RBC: 4.45 x10E6/uL (ref 3.77–5.28)
RDW: 13.8 % (ref 11.7–15.4)
WBC: 5.1 10*3/uL (ref 3.4–10.8)

## 2023-09-24 LAB — COMPREHENSIVE METABOLIC PANEL WITH GFR
ALT: 15 IU/L (ref 0–32)
AST: 16 IU/L (ref 0–40)
Albumin: 4.4 g/dL (ref 3.8–4.9)
Alkaline Phosphatase: 101 IU/L (ref 44–121)
BUN/Creatinine Ratio: 18 (ref 9–23)
BUN: 19 mg/dL (ref 6–24)
Bilirubin Total: 0.7 mg/dL (ref 0.0–1.2)
CO2: 23 mmol/L (ref 20–29)
Calcium: 9.6 mg/dL (ref 8.7–10.2)
Chloride: 104 mmol/L (ref 96–106)
Creatinine, Ser: 1.08 mg/dL — ABNORMAL HIGH (ref 0.57–1.00)
Globulin, Total: 2.8 g/dL (ref 1.5–4.5)
Glucose: 112 mg/dL — ABNORMAL HIGH (ref 70–99)
Potassium: 4.3 mmol/L (ref 3.5–5.2)
Sodium: 140 mmol/L (ref 134–144)
Total Protein: 7.2 g/dL (ref 6.0–8.5)
eGFR: 60 mL/min/{1.73_m2} (ref >59)

## 2023-09-24 LAB — TSH: TSH: 4 u[IU]/mL (ref 0.450–4.500)

## 2023-09-24 LAB — LIPID PANEL
Chol/HDL Ratio: 3.1 ratio (ref 0.0–4.4)
Cholesterol, Total: 160 mg/dL (ref 100–199)
HDL: 51 mg/dL (ref >39)
LDL Chol Calc (NIH): 99 mg/dL (ref 0–99)
Triglycerides: 49 mg/dL (ref 0–149)
VLDL Cholesterol Cal: 10 mg/dL (ref 5–40)

## 2023-09-24 MED ORDER — ATORVASTATIN CALCIUM 20 MG PO TABS: 20.0000 mg | ORAL_TABLET | Freq: Every day | ORAL | 3 refills | Status: AC

## 2023-09-24 NOTE — Addendum Note (Signed)
 Addended by: Watson Hacking on: 09/24/2023 08:54 AM   Modules accepted: Orders

## 2023-09-27 ENCOUNTER — Other Ambulatory Visit: Payer: Self-pay | Admitting: Family Medicine

## 2023-09-27 DIAGNOSIS — E890 Postprocedural hypothyroidism: Secondary | ICD-10-CM

## 2023-11-04 ENCOUNTER — Telehealth: Payer: Self-pay | Admitting: Family Medicine

## 2023-11-04 NOTE — Telephone Encounter (Signed)
 Pt is due for diabetic eye exam, no record of pt having one, left message for pt

## 2023-11-25 ENCOUNTER — Other Ambulatory Visit (INDEPENDENT_AMBULATORY_CARE_PROVIDER_SITE_OTHER)

## 2023-11-25 DIAGNOSIS — Z23 Encounter for immunization: Secondary | ICD-10-CM

## 2024-01-17 ENCOUNTER — Other Ambulatory Visit: Payer: Self-pay | Admitting: Family Medicine

## 2024-01-17 DIAGNOSIS — E890 Postprocedural hypothyroidism: Secondary | ICD-10-CM

## 2024-03-13 ENCOUNTER — Other Ambulatory Visit: Payer: Self-pay | Admitting: Family Medicine

## 2024-03-13 DIAGNOSIS — Z1231 Encounter for screening mammogram for malignant neoplasm of breast: Secondary | ICD-10-CM

## 2024-03-14 DIAGNOSIS — Z124 Encounter for screening for malignant neoplasm of cervix: Secondary | ICD-10-CM | POA: Diagnosis not present

## 2024-03-14 DIAGNOSIS — Z803 Family history of malignant neoplasm of breast: Secondary | ICD-10-CM | POA: Diagnosis not present

## 2024-03-14 DIAGNOSIS — Z01419 Encounter for gynecological examination (general) (routine) without abnormal findings: Secondary | ICD-10-CM | POA: Diagnosis not present

## 2024-03-14 DIAGNOSIS — R87619 Unspecified abnormal cytological findings in specimens from cervix uteri: Secondary | ICD-10-CM | POA: Diagnosis not present

## 2024-03-14 DIAGNOSIS — Z1151 Encounter for screening for human papillomavirus (HPV): Secondary | ICD-10-CM | POA: Diagnosis not present

## 2024-03-14 DIAGNOSIS — Z6841 Body Mass Index (BMI) 40.0 and over, adult: Secondary | ICD-10-CM | POA: Diagnosis not present

## 2024-03-30 ENCOUNTER — Encounter: Payer: Self-pay | Admitting: Family Medicine

## 2024-03-30 ENCOUNTER — Ambulatory Visit: Payer: Self-pay | Admitting: Family Medicine

## 2024-03-30 VITALS — BP 124/78 | HR 93 | Wt 297.0 lb

## 2024-03-30 DIAGNOSIS — E1165 Type 2 diabetes mellitus with hyperglycemia: Secondary | ICD-10-CM | POA: Diagnosis not present

## 2024-03-30 DIAGNOSIS — E1159 Type 2 diabetes mellitus with other circulatory complications: Secondary | ICD-10-CM

## 2024-03-30 DIAGNOSIS — E1169 Type 2 diabetes mellitus with other specified complication: Secondary | ICD-10-CM

## 2024-03-30 DIAGNOSIS — Z23 Encounter for immunization: Secondary | ICD-10-CM | POA: Diagnosis not present

## 2024-03-30 DIAGNOSIS — E785 Hyperlipidemia, unspecified: Secondary | ICD-10-CM | POA: Diagnosis not present

## 2024-03-30 DIAGNOSIS — I152 Hypertension secondary to endocrine disorders: Secondary | ICD-10-CM | POA: Diagnosis not present

## 2024-03-30 LAB — POCT GLYCOSYLATED HEMOGLOBIN (HGB A1C): Hemoglobin A1C: 6.1 % — AB (ref 4.0–5.6)

## 2024-03-30 NOTE — Progress Notes (Unsigned)
 Subjective:    Patient ID: Katie Aguirre, female    DOB: 12-21-1965, 58 y.o.   MRN: [id number]  Katie Aguirre is a 58 y.o. female who presents for follow-up of Type 2 diabetes mellitus.  Home blood sugar records: no Current symptoms/problems include no and have been stable. Daily foot checks: does not   Any foot concerns: no How often blood sugars checked:not checking Exercise: Home exercise routine includes walking 1 hrs per days.3 Diet: no Discussed the use of AI scribe software for clinical note transcription with the patient, who gave verbal consent to proceed.   She has diabetes with a recent A1c of 6.1, improved from 7.0 in January. She manages her condition through diet and exercise, walking three days a week, and has lost weight from 291 pounds in January to 282 pounds currently.  She was previously prescribed atorvastatin  for cholesterol management but has not started it. Her LDL is 114.  She experiences seasonal depression, particularly from September to the end of the year, related to the anniversary of her husband's passing. It does not affect her ability to function.  She recently received a shingles vaccine.  She reports experiencing occasional chest pains, but no further details were provided.      The following portions of the patient's history were reviewed and updated as appropriate: allergies, current medications, past medical history, past social history and problem list.  ROS as in subjective above.     Objective:    Physical Exam Alert and in no distress otherwise not examined.  Hemoglobin A1c is 6.1 Lab Review    Latest Ref Rng & Units 09/23/2023   10:08 AM 09/23/2023   10:03 AM 09/23/2023    9:57 AM 03/16/2023    9:59 AM 06/10/2022    2:42 PM  Diabetic Labs  HbA1c 4.0 - 5.6 %   6.2  6.0    Microalbumin mg/L 23.2       Micro/Creat Ratio  35.6       Chol 100 - 199 mg/dL  839    848   HDL >60 mg/dL  51    57   Calc LDL 0 - 99 mg/dL   99    84   Triglycerides 0 - 149 mg/dL  49    47   Creatinine 0.57 - 1.00 mg/dL  8.91    8.80       88/86/7974    8:48 AM 09/23/2023    8:49 AM 06/11/2023   11:10 AM 03/16/2023    8:40 AM 09/04/2022    1:35 PM  BP/Weight  Systolic BP 124 128 120 138 130  Diastolic BP 78 90 74 84 78  Wt. (Lbs) 297 287.4 286.2 281.8 282  BMI 49.42 kg/m2 47.83 kg/m2 47.63 kg/m2 46.89 kg/m2 48.41 kg/m2      09/23/2023    9:00 AM  Foot/eye exam completion dates  Foot Form Completion Done    Katie Aguirre  reports that she has never smoked. She has never used smokeless tobacco. She reports current alcohol use. She reports that she does not use drugs.     Assessment & Plan:    Type 2 diabetes mellitus with hyperglycemia, without long-term current use of insulin (HCC) - Plan: POCT glycosylated hemoglobin (Hb A1C)  Hypertension associated with diabetes (HCC)  Hyperlipidemia associated with type 2 diabetes mellitus (HCC)  Need for influenza vaccination - Plan: Flu vaccine trivalent PF, 6mos and older(Flulaval,Afluria,Fluarix,Fluzone)  Morbid obesity (HCC) I again discussed treatment of  diabetes even in spite of the fact that her A1c is good in regard to diet, exercise, appropriate medication for her cholesterol and potentially medicine for the diabetes and blood pressure.  At this point she is not interested in being on any medications.  She recognizes the risk.

## 2024-04-16 ENCOUNTER — Other Ambulatory Visit: Payer: Self-pay | Admitting: Family Medicine

## 2024-04-16 DIAGNOSIS — E890 Postprocedural hypothyroidism: Secondary | ICD-10-CM

## 2024-04-18 ENCOUNTER — Ambulatory Visit
Admission: RE | Admit: 2024-04-18 | Discharge: 2024-04-18 | Disposition: A | Source: Ambulatory Visit | Attending: Family Medicine | Admitting: Family Medicine

## 2024-04-18 DIAGNOSIS — Z1231 Encounter for screening mammogram for malignant neoplasm of breast: Secondary | ICD-10-CM

## 2024-06-19 ENCOUNTER — Other Ambulatory Visit: Payer: Self-pay | Admitting: Family Medicine

## 2024-06-19 DIAGNOSIS — I152 Hypertension secondary to endocrine disorders: Secondary | ICD-10-CM

## 2024-06-23 ENCOUNTER — Other Ambulatory Visit: Payer: Self-pay | Admitting: Family Medicine

## 2024-06-23 DIAGNOSIS — E890 Postprocedural hypothyroidism: Secondary | ICD-10-CM
